# Patient Record
Sex: Female | Born: 1990 | Race: Black or African American | Hispanic: No | Marital: Single | Smoking: Current every day smoker
Health system: Southern US, Community
[De-identification: ages and names within clinical notes are randomized; demographics above are authoritative.]

## PROBLEM LIST (undated history)

## (undated) ENCOUNTER — Ambulatory Visit (HOSPITAL_COMMUNITY): Payer: Medicaid Other

## (undated) DIAGNOSIS — Z789 Other specified health status: Secondary | ICD-10-CM

## (undated) HISTORY — PX: THERAPEUTIC ABORTION: SHX798

## (undated) HISTORY — PX: WISDOM TOOTH EXTRACTION: SHX21

---

## 2019-10-09 NOTE — L&D Delivery Note (Signed)
OB/GYN Faculty Practice Delivery Note  Sheri Hanna is a 29 y.o. [id number] s/p NSVD at [redacted]w[redacted]d. She was admitted for PPROM.   ROM: 12h 33m with particulate meconium fluid GBS Status: unknown   Maximum Maternal Temperature: 99.4 F    Labor Progress: . Patient arrived at 1 cm dilation and was managed expectantly on [facility] specialty care. After approximately 8 hours she started to feel contractions and was found to be 3-4 cm and was transferred to L&D, she then progressed quickly to fully dilated four hours later and had uncomplicated NSVD.   Delivery Date/Time: 04/25/2020 at 0753 Delivery: Called to room and patient was complete and pushing. Head delivered in OA position. No nuchal cord present. Shoulder and body delivered in usual fashion. Infant without spontaneous cry and poor tone, cord clamped and cut immediately and infant taken to warmer. Arterial cord gas and venous blood drawn. Placenta delivered spontaneously with gentle cord traction. Fundus firm with massage and Pitocin. Labia, perineum, vagina, and cervix inspected with no lacerations.   Placenta: 3v intact to L&D Complications: preterm delivery Lacerations: none EBL: 300 cc Analgesia: none   Infant: Female infant  APGAR (1 MIN): 4   APGAR (5 MINS): 7    Weight: 2300 grams  Zack Seal, MD/[facility] OB/GYN Fellow, Faculty Practice

## 2019-10-21 LAB — OB RESULTS CONSOLE HIV ANTIBODY (ROUTINE TESTING): HIV: NONREACTIVE

## 2019-10-21 LAB — OB RESULTS CONSOLE HEPATITIS B SURFACE ANTIGEN: Hepatitis B Surface Ag: NEGATIVE

## 2019-10-21 LAB — OB RESULTS CONSOLE TSH: TSH: 1.09

## 2019-11-15 ENCOUNTER — Inpatient Hospital Stay (HOSPITAL_COMMUNITY): Payer: Medicaid Other

## 2019-11-15 ENCOUNTER — Inpatient Hospital Stay (HOSPITAL_COMMUNITY)
Admission: AD | Admit: 2019-11-15 | Discharge: 2019-11-15 | Disposition: A | Discharge status: Home / Self Care | Payer: Medicaid Other | Attending: Obstetrics and Gynecology | Admitting: Obstetrics and Gynecology

## 2019-11-15 ENCOUNTER — Other Ambulatory Visit: Payer: Self-pay

## 2019-11-15 ENCOUNTER — Encounter (HOSPITAL_COMMUNITY): Payer: Self-pay | Admitting: Obstetrics and Gynecology

## 2019-11-15 DIAGNOSIS — O418X10 Other specified disorders of amniotic fluid and membranes, first trimester, not applicable or unspecified: Secondary | ICD-10-CM | POA: Diagnosis not present

## 2019-11-15 DIAGNOSIS — O4690 Antepartum hemorrhage, unspecified, unspecified trimester: Secondary | ICD-10-CM

## 2019-11-15 DIAGNOSIS — Z3A08 8 weeks gestation of pregnancy: Secondary | ICD-10-CM | POA: Diagnosis not present

## 2019-11-15 DIAGNOSIS — O208 Other hemorrhage in early pregnancy: Secondary | ICD-10-CM | POA: Diagnosis not present

## 2019-11-15 DIAGNOSIS — Z6790 Unspecified blood type, Rh positive: Secondary | ICD-10-CM

## 2019-11-15 DIAGNOSIS — Z3490 Encounter for supervision of normal pregnancy, unspecified, unspecified trimester: Secondary | ICD-10-CM

## 2019-11-15 DIAGNOSIS — O468X1 Other antepartum hemorrhage, first trimester: Secondary | ICD-10-CM

## 2019-11-15 DIAGNOSIS — Z87891 Personal history of nicotine dependence: Secondary | ICD-10-CM | POA: Insufficient documentation

## 2019-11-15 DIAGNOSIS — O209 Hemorrhage in early pregnancy, unspecified: Secondary | ICD-10-CM | POA: Diagnosis present

## 2019-11-15 LAB — COMPREHENSIVE METABOLIC PANEL
ALT: 12 U/L (ref 0–44)
AST: 15 U/L (ref 15–41)
Albumin: 3.8 g/dL (ref 3.5–5.0)
Alkaline Phosphatase: 34 U/L — ABNORMAL LOW (ref 38–126)
Anion gap: 7 (ref 5–15)
BUN: 8 mg/dL (ref 6–20)
CO2: 27 mmol/L (ref 22–32)
Calcium: 9.8 mg/dL (ref 8.9–10.3)
Chloride: 103 mmol/L (ref 98–111)
Creatinine, Ser: 0.57 mg/dL (ref 0.44–1.00)
GFR calc Af Amer: >60 mL/min (ref >60)
GFR calc non Af Amer: >60 mL/min (ref >60)
Glucose, Bld: 91 mg/dL (ref 70–99)
Potassium: 4.1 mmol/L (ref 3.5–5.1)
Sodium: 137 mmol/L (ref 135–145)
Total Bilirubin: 0.2 mg/dL — ABNORMAL LOW (ref 0.3–1.2)
Total Protein: 6.5 g/dL (ref 6.5–8.1)

## 2019-11-15 LAB — ABO/RH: ABO/RH(D): B POS

## 2019-11-15 LAB — URINALYSIS, ROUTINE W REFLEX MICROSCOPIC
Bilirubin Urine: NEGATIVE
Glucose, UA: NEGATIVE mg/dL
Hgb urine dipstick: NEGATIVE
Ketones, ur: NEGATIVE mg/dL
Leukocytes,Ua: NEGATIVE
Nitrite: NEGATIVE
Protein, ur: NEGATIVE mg/dL
Specific Gravity, Urine: 1.027 (ref 1.005–1.030)
pH: 8 (ref 5.0–8.0)

## 2019-11-15 LAB — WET PREP, GENITAL
Sperm: NONE SEEN
Trich, Wet Prep: NONE SEEN
Yeast Wet Prep HPF POC: NONE SEEN

## 2019-11-15 LAB — CBC
HCT: 36.4 % (ref 36.0–46.0)
Hemoglobin: 12.5 g/dL (ref 12.0–15.0)
MCH: 30.6 pg (ref 26.0–34.0)
MCHC: 34.3 g/dL (ref 30.0–36.0)
MCV: 89 fL (ref 80.0–100.0)
Platelets: 245 10*3/uL (ref 150–400)
RBC: 4.09 MIL/uL (ref 3.87–5.11)
RDW: 12.8 % (ref 11.5–15.5)
WBC: 6.3 10*3/uL (ref 4.0–10.5)
nRBC: 0 % (ref 0.0–0.2)

## 2019-11-15 LAB — POCT PREGNANCY, URINE: Preg Test, Ur: POSITIVE — AB

## 2019-11-15 LAB — HCG, QUANTITATIVE, PREGNANCY: hCG, Beta Chain, Quant, S: 126882 m[IU]/mL — ABNORMAL HIGH (ref <5)

## 2019-11-15 NOTE — Discharge Instructions (Signed)
 Abdominal Pain During Pregnancy  Abdominal pain is common during pregnancy, and has many possible causes. Some causes are more serious than others, and sometimes the cause is not known. Abdominal pain can be a sign that labor is starting. It can also be caused by normal growth and stretching of muscles and ligaments during pregnancy. Always tell your health care provider if you have any abdominal pain. Follow these instructions at home:  Do not have sex or put anything in your vagina until your pain goes away completely.  Get plenty of rest until your pain improves.  Drink enough fluid to keep your urine pale yellow.  Take over-the-counter and prescription medicines only as told by your health care provider.  Keep all follow-up visits as told by your health care provider. This is important. Contact a health care provider if:  Your pain continues or gets worse after resting.  You have lower abdominal pain that: ? Comes and goes at regular intervals. ? Spreads to your back. ? Is similar to menstrual cramps.  You have pain or burning when you urinate. Get help right away if:  You have a fever or chills.  You have vaginal bleeding.  You are leaking fluid from your vagina.  You are passing tissue from your vagina.  You have vomiting or diarrhea that lasts for more than 24 hours.  Your baby is moving less than usual.  You feel very weak or faint.  You have shortness of breath.  You develop severe pain in your upper abdomen. Summary  Abdominal pain is common during pregnancy, and has many possible causes.  If you experience abdominal pain during pregnancy, tell your health care provider right away.  Follow your health care provider's home care instructions and keep all follow-up visits as directed. This information is not intended to replace advice given to you by your health care provider. Make sure you discuss any questions you have with your health care  provider. Document Revised: 01/12/2019 Document Reviewed: 12/27/2016 Elsevier Patient Education  2020 [employer]  First Trimester of Pregnancy The first trimester of pregnancy is from week 1 until the end of week 13 (months 1 through 3). A week after a sperm fertilizes an egg, the egg will implant on the wall of the uterus. This embryo will begin to develop into a baby. Genes from you and your partner will form the baby. The female genes will determine whether the baby will be a boy or a girl. At 6-8 weeks, the eyes and face will be formed, and the heartbeat can be seen on ultrasound. At the end of 12 weeks, all the baby's organs will be formed. Now that you are pregnant, you will want to do everything you can to have a healthy baby. Two of the most important things are to get good prenatal care and to follow your health care provider's instructions. Prenatal care is all the medical care you receive before the baby's birth. This care will help prevent, find, and treat any problems during the pregnancy and childbirth. Body changes during your first trimester Your body goes through many changes during pregnancy. The changes vary from woman to woman.  You may gain or lose a couple of pounds at first.  You may feel sick to your stomach (nauseous) and you may throw up (vomit). If the vomiting is uncontrollable, call your health care provider.  You may tire easily.  You may develop headaches that can be relieved by medicines. All medicines   should be approved by your health care provider.  You may urinate more often. Painful urination may mean you have a bladder infection.  You may develop heartburn as a result of your pregnancy.  You may develop constipation because certain hormones are causing the muscles that push stool through your intestines to slow down.  You may develop hemorrhoids or swollen veins (varicose veins).  Your breasts may begin to grow larger and become tender. Your nipples  may stick out more, and the tissue that surrounds them (areola) may become darker.  Your gums may bleed and may be sensitive to brushing and flossing.  Dark spots or blotches (chloasma, mask of pregnancy) may develop on your face. This will likely fade after the baby is born.  Your menstrual periods will stop.  You may have a loss of appetite.  You may develop cravings for certain kinds of food.  You may have changes in your emotions from day to day, such as being excited to be pregnant or being concerned that something may go wrong with the pregnancy and baby.  You may have more vivid and strange dreams.  You may have changes in your hair. These can include thickening of your hair, rapid growth, and changes in texture. Some women also have hair loss during or after pregnancy, or hair that feels dry or thin. Your hair will most likely return to normal after your baby is born. What to expect at prenatal visits During a routine prenatal visit:  You will be weighed to make sure you and the baby are growing normally.  Your blood pressure will be taken.  Your abdomen will be measured to track your baby's growth.  The fetal heartbeat will be listened to between weeks 10 and 14 of your pregnancy.  Test results from any previous visits will be discussed. Your health care provider may ask you:  How you are feeling.  If you are feeling the baby move.  If you have had any abnormal symptoms, such as leaking fluid, bleeding, severe headaches, or abdominal cramping.  If you are using any tobacco products, including cigarettes, chewing tobacco, and electronic cigarettes.  If you have any questions. Other tests that may be performed during your first trimester include:  Blood tests to find your blood type and to check for the presence of any previous infections. The tests will also be used to check for low iron levels (anemia) and protein on red blood cells (Rh antibodies). Depending on  your risk factors, or if you previously had diabetes during pregnancy, you may have tests to check for high blood sugar that affects pregnant women (gestational diabetes).  Urine tests to check for infections, diabetes, or protein in the urine.  An ultrasound to confirm the proper growth and development of the baby.  Fetal screens for spinal cord problems (spina bifida) and Down syndrome.  HIV (human immunodeficiency virus) testing. Routine prenatal testing includes screening for HIV, unless you choose not to have this test.  You may need other tests to make sure you and the baby are doing well. Follow these instructions at home: Medicines  Follow your health care provider's instructions regarding medicine use. Specific medicines may be either safe or unsafe to take during pregnancy.  Take a prenatal vitamin that contains at least 600 micrograms (mcg) of folic acid.  If you develop constipation, try taking a stool softener if your health care provider approves. Eating and drinking   Eat a balanced diet that includes fresh   fruits and vegetables, whole grains, good sources of protein such as meat, eggs, or tofu, and low-fat dairy. Your health care provider will help you determine the amount of weight gain that is right for you.  Avoid raw meat and uncooked cheese. These carry germs that can cause birth defects in the baby.  Eating four or five small meals rather than three large meals a day may help relieve nausea and vomiting. If you start to feel nauseous, eating a few soda crackers can be helpful. Drinking liquids between meals, instead of during meals, also seems to help ease nausea and vomiting.  Limit foods that are high in fat and processed sugars, such as fried and sweet foods.  To prevent constipation: ? Eat foods that are high in fiber, such as fresh fruits and vegetables, whole grains, and beans. ? Drink enough fluid to keep your urine clear or pale  yellow. Activity  Exercise only as directed by your health care provider. Most women can continue their usual exercise routine during pregnancy. Try to exercise for 30 minutes at least 5 days a week. Exercising will help you: ? Control your weight. ? Stay in shape. ? Be prepared for labor and delivery.  Experiencing pain or cramping in the lower abdomen or lower back is a good sign that you should stop exercising. Check with your health care provider before continuing with normal exercises.  Try to avoid standing for long periods of time. Move your legs often if you must stand in one place for a long time.  Avoid heavy lifting.  Wear low-heeled shoes and practice good posture.  You may continue to have sex unless your health care provider tells you not to. Relieving pain and discomfort  Wear a good support bra to relieve breast tenderness.  Take warm sitz baths to soothe any pain or discomfort caused by hemorrhoids. Use hemorrhoid cream if your health care provider approves.  Rest with your legs elevated if you have leg cramps or low back pain.  If you develop varicose veins in your legs, wear support hose. Elevate your feet for 15 minutes, 3-4 times a day. Limit salt in your diet. Prenatal care  Schedule your prenatal visits by the twelfth week of pregnancy. They are usually scheduled monthly at first, then more often in the last 2 months before delivery.  Write down your questions. Take them to your prenatal visits.  Keep all your prenatal visits as told by your health care provider. This is important. Safety  Wear your seat belt at all times when driving.  Make a list of emergency phone numbers, including numbers for family, friends, the hospital, and police and fire departments. General instructions  Ask your health care provider for a referral to a local prenatal education class. Begin classes no later than the beginning of month 6 of your pregnancy.  Ask for help if  you have counseling or nutritional needs during pregnancy. Your health care provider can offer advice or refer you to specialists for help with various needs.  Do not use hot tubs, steam rooms, or saunas.  Do not douche or use tampons or scented sanitary pads.  Do not cross your legs for long periods of time.  Avoid cat litter boxes and soil used by cats. These carry germs that can cause birth defects in the baby and possibly loss of the fetus by miscarriage or stillbirth.  Avoid all smoking, herbs, alcohol, and medicines not prescribed by your health care provider. Chemicals   in these products affect the formation and growth of the baby.  Do not use any products that contain nicotine or tobacco, such as cigarettes and e-cigarettes. If you need help quitting, ask your health care provider. You may receive counseling support and other resources to help you quit.  Schedule a dentist appointment. At home, brush your teeth with a soft toothbrush and be gentle when you floss. Contact a health care provider if:  You have dizziness.  You have mild pelvic cramps, pelvic pressure, or nagging pain in the abdominal area.  You have persistent nausea, vomiting, or diarrhea.  You have a bad smelling vaginal discharge.  You have pain when you urinate.  You notice increased swelling in your face, hands, legs, or ankles.  You are exposed to fifth disease or chickenpox.  You are exposed to German measles (rubella) and have never had it. Get help right away if:  You have a fever.  You are leaking fluid from your vagina.  You have spotting or bleeding from your vagina.  You have severe abdominal cramping or pain.  You have rapid weight gain or loss.  You vomit blood or material that looks like coffee grounds.  You develop a severe headache.  You have shortness of breath.  You have any kind of trauma, such as from a fall or a car accident. Summary  The first trimester of pregnancy is  from week 1 until the end of week 13 (months 1 through 3).  Your body goes through many changes during pregnancy. The changes vary from woman to woman.  You will have routine prenatal visits. During those visits, your health care provider will examine you, discuss any test results you may have, and talk with you about how you are feeling. This information is not intended to replace advice given to you by your health care provider. Make sure you discuss any questions you have with your health care provider. Document Revised: 09/06/2017 Document Reviewed: 09/05/2016 Elsevier Patient Education  2020 [employer] Inc.  Vaginal Bleeding During Pregnancy, First Trimester  A small amount of bleeding from the vagina (spotting) is relatively common during early pregnancy. It usually stops on its own. Various things may cause bleeding or spotting during early pregnancy. Some bleeding may be related to the pregnancy, and some may not. In many cases, the bleeding is normal and is not a problem. However, bleeding can also be a sign of something serious. Be sure to tell your health care provider about any vaginal bleeding right away. Some possible causes of vaginal bleeding during the first trimester include:  Infection or inflammation of the cervix.  Growths (polyps) on the cervix.  Miscarriage or threatened miscarriage.  Pregnancy tissue developing outside of the uterus (ectopic pregnancy).  A mass of tissue developing in the uterus due to an egg being fertilized incorrectly (molar pregnancy). Follow these instructions at home: Activity  Follow instructions from your health care provider about limiting your activity. Ask what activities are safe for you.  If needed, make plans for someone to help with your regular activities.  Do not have sex or orgasms until your health care provider says that this is safe. General instructions  Take over-the-counter and prescription medicines only as told by your  health care provider.  Pay attention to any changes in your symptoms.  Do not use tampons or douche.  Write down how many pads you use each day, how often you change pads, and how soaked (saturated) they are.    If you pass any tissue from your vagina, save the tissue so you can show it to your health care provider.  Keep all follow-up visits as told by your health care provider. This is important. Contact a health care provider if:  You have vaginal bleeding during any part of your pregnancy.  You have cramps or labor pains.  You have a fever. Get help right away if:  You have severe cramps in your back or abdomen.  You pass large clots or a large amount of tissue from your vagina.  Your bleeding increases.  You feel light-headed or weak, or you faint.  You have chills.  You are leaking fluid or have a gush of fluid from your vagina. Summary  A small amount of bleeding (spotting) from the vagina is relatively common during early pregnancy.  Various things may cause bleeding or spotting in early pregnancy.  Be sure to tell your health care provider about any vaginal bleeding right away. This information is not intended to replace advice given to you by your health care provider. Make sure you discuss any questions you have with your health care provider. Document Revised: 01/13/2019 Document Reviewed: 12/27/2016 Elsevier Patient Education  2020 [employer] Inc.  Subchorionic Hematoma  A subchorionic hematoma is a gathering of blood between the outer wall of the embryo (chorion) and the inner wall of the womb (uterus). This condition can cause vaginal bleeding. If they cause little or no vaginal bleeding, early small hematomas usually shrink on their own and do not affect your baby or pregnancy. When bleeding starts later in pregnancy, or if the hematoma is larger or occurs in older pregnant women, the condition may be more serious. Larger hematomas may get bigger, which  increases the chances of miscarriage. This condition also increases the risk of:  Premature separation of the placenta from the uterus.  Premature (preterm) labor.  Stillbirth. What are the causes? The exact cause of this condition is not known. It occurs when blood is trapped between the placenta and the uterine wall because the placenta has separated from the original site of implantation. What increases the risk? You are more likely to develop this condition if:  You were treated with fertility medicines.  You conceived through in vitro fertilization (IVF). What are the signs or symptoms? Symptoms of this condition include:  Vaginal spotting or bleeding.  Contractions of the uterus. These cause abdominal pain. Sometimes you may have no symptoms and the bleeding may only be seen when ultrasound images are taken (transvaginal ultrasound). How is this diagnosed? This condition is diagnosed based on a physical exam. This includes a pelvic exam. You may also have other tests, including:  Blood tests.  Urine tests.  Ultrasound of the abdomen. How is this treated? Treatment for this condition can vary. Treatment may include:  Watchful waiting. You will be monitored closely for any changes in bleeding. During this stage: ? The hematoma may be reabsorbed by the body. ? The hematoma may separate the fluid-filled space containing the embryo (gestational sac) from the wall of the womb (endometrium).  Medicines.  Activity restriction. This may be needed until the bleeding stops. Follow these instructions at home:  Stay on bed rest if told to do so by your health care provider.  Do not lift anything that is heavier than 10 lbs. (4.5 kg) or as told by your health care provider.  Do not use any products that contain nicotine or tobacco, such as cigarettes and e-cigarettes.   If you need help quitting, ask your health care provider.  Track and write down the number of pads you use  each day and how soaked (saturated) they are.  Do not use tampons.  Keep all follow-up visits as told by your health care provider. This is important. Your health care provider may ask you to have follow-up blood tests or ultrasound tests or both. Contact a health care provider if:  You have any vaginal bleeding.  You have a fever. Get help right away if:  You have severe cramps in your stomach, back, abdomen, or pelvis.  You pass large clots or tissue. Save any tissue for your health care provider to look at.  You have more vaginal bleeding, and you faint or become lightheaded or weak. Summary  A subchorionic hematoma is a gathering of blood between the outer wall of the placenta and the uterus.  This condition can cause vaginal bleeding.  Sometimes you may have no symptoms and the bleeding may only be seen when ultrasound images are taken.  Treatment may include watchful waiting, medicines, or activity restriction. This information is not intended to replace advice given to you by your health care provider. Make sure you discuss any questions you have with your health care provider. Document Revised: 09/06/2017 Document Reviewed: 11/20/2016 Elsevier Patient Education  2020 [employer] Inc.                  Safe Medications in Pregnancy    Acne: Benzoyl Peroxide Salicylic Acid  Backache/Headache: Tylenol: 2 regular strength every 4 hours OR              2 Extra strength every 6 hours  Colds/Coughs/Allergies: Benadryl (alcohol free) 25 mg every 6 hours as needed Breath right strips Claritin Cepacol throat lozenges Chloraseptic throat spray Cold-Eeze- up to three times per day Cough drops, alcohol free Flonase (by prescription only) Guaifenesin Mucinex Robitussin DM (plain only, alcohol free) Saline nasal spray/drops Sudafed (pseudoephedrine) & Actifed ** use only after [redacted] weeks gestation and if you do not have high blood pressure Tylenol Vicks Vaporub Zinc  lozenges Zyrtec   Constipation: Colace Ducolax suppositories Fleet enema Glycerin suppositories Metamucil Milk of magnesia Miralax Senokot Smooth move tea  Diarrhea: Kaopectate Imodium A-D  *NO pepto Bismol  Hemorrhoids: Anusol Anusol HC Preparation H Tucks  Indigestion: Tums Maalox Mylanta Zantac  Pepcid  Insomnia: Benadryl (alcohol free) 25mg every 6 hours as needed Tylenol PM Unisom, no Gelcaps  Leg Cramps: Tums MagGel  Nausea/Vomiting:  Bonine Dramamine Emetrol Ginger extract Sea bands Meclizine  Nausea medication to take during pregnancy:  Unisom (doxylamine succinate 25 mg tablets) Take one tablet daily at bedtime. If symptoms are not adequately controlled, the dose can be increased to a maximum recommended dose of two tablets daily (1/2 tablet in the morning, 1/2 tablet mid-afternoon and one at bedtime). Vitamin B6 100mg tablets. Take one tablet twice a day (up to 200 mg per day).  Skin Rashes: Aveeno products Benadryl cream or 25mg every 6 hours as needed Calamine Lotion 1% cortisone cream  Yeast infection: Gyne-lotrimin 7 Monistat 7   **If taking multiple medications, please check labels to avoid duplicating the same active ingredients **take medication as directed on the label ** Do not exceed 4000 mg of tylenol in 24 hours **Do not take medications that contain aspirin or ibuprofen 

## 2019-11-15 NOTE — [facility] Note (Signed)
Sheri Hanna is a 29 y.o. at [redacted]w[redacted]d here in [facility] reporting: spotting and cramping that started today. Did not see any bleeding when she used the bathroom in [facility]U. Cramping is in her back.   Has been seen in [place] for this pregnancy. States she has had blood work and u/s. States they saw cardiac activity on jan 26.   LMP: 09/15/19  Onset of complaint: today  Pain score: 5/10  Vitals:   11/15/19 1314  BP: 114/67  Pulse: (!) 101  Resp: 16  Temp: 98.9 F (37.2 C)  SpO2: 100%     Lab orders placed from triage: UA, UPT

## 2019-11-15 NOTE — MAU Provider Note (Signed)
History     CSN: [id number]  Arrival date and time: 11/15/19 1251   First Provider Initiated Contact with Patient 11/15/19 1404      Chief Complaint  Patient presents with  . Vaginal Bleeding  . Back Pain   [name] is a 29 y.o. [id number] at [redacted]w[redacted]d who presents to MAU for vaginal bleeding which began this morning around 1143AM. Pt reports the bleeding was light pink when wiping. Pt gets OB care in [place] and is in [place] visiting family.  Passing blood clots? no Blood soaking clothes? no Lightheaded/dizzy? no Significant pelvic pain or cramping? minimal Passed any tissue? no Hx ectopic pregnancy? no Hx of PID, GYN surgery? no Hx of recurrent pregnancy loss/associated condition? no, medical ABx2  Current pregnancy problems? Left ovarian cyst per OB in [place], spotting in first 4 weeks cause unknown Blood Type? unknown Allergies? NKDA Current medications? PNVs Current [facility] & next appt? [place] [facility], 11/23/2019  Pt denies vaginal discharge/odor/itching. Pt denies N/V, abdominal pain, constipation, diarrhea, or urinary problems. Pt denies fever, chills, fatigue, sweating or changes in appetite. Pt denies SOB or chest pain. Pt denies dizziness, HA, light-headedness, weakness.   OB History    Gravida  8   Para  1   Term  1   Preterm      AB  6   Living        SAB      TAB  6   Ectopic      Multiple      Live Births              History reviewed. No pertinent past medical history.  Past Surgical History:  Procedure Laterality Date  . THERAPEUTIC ABORTION    . WISDOM TOOTH EXTRACTION      Family History  Problem Relation Age of Onset  . Hypertension Mother   . Diabetes Maternal Grandmother     Social History   Tobacco Use  . Smoking status: Former Games developer  . Smokeless tobacco: Never Used  . Tobacco comment: none since + UPT  Substance Use Topics  . Alcohol use: Not Currently  . Drug use: Never    Allergies: No Known Allergies  No  medications prior to admission.    Review of Systems  Constitutional: Negative for chills, diaphoresis, fatigue and fever.  Eyes: Negative for visual disturbance.  Respiratory: Negative for shortness of breath.   Cardiovascular: Negative for chest pain.  Gastrointestinal: Negative for abdominal pain, constipation, diarrhea, nausea and vomiting.  Genitourinary: Positive for pelvic pain (minimal cramping) and vaginal bleeding. Negative for dysuria, flank pain, frequency, urgency and vaginal discharge.  Neurological: Negative for dizziness, weakness, light-headedness and headaches.   Physical Exam   Blood pressure 114/67, pulse (!) 101, temperature 98.9 F (37.2 C), temperature source Oral, resp. rate 16, height 5\' 2"  (1.575 m), weight 57.8 kg, last menstrual period 09/15/2019, SpO2 100 %.  Patient Vitals for the past 24 hrs:  BP Temp Temp src Pulse Resp SpO2 Height Weight  11/15/19 1314 114/67 98.9 F (37.2 C) Oral (!) 101 16 100 % 5\' 2"  (1.575 m) 57.8 kg   Physical Exam  Constitutional: She is oriented to person, place, and time. She appears well-developed and well-nourished. No distress.  HENT:  Head: Normocephalic and atraumatic.  Respiratory: Effort normal.  GI: Soft. She exhibits no distension and no mass. There is no abdominal tenderness. There is no rebound and no guarding.  Genitourinary: There is no rash, tenderness  or lesion on the right labia. There is no rash, tenderness or lesion on the left labia. Uterus is not enlarged and not tender. Cervix exhibits no motion tenderness, no discharge and no friability. Right adnexum displays no mass, no tenderness and no fullness. Left adnexum displays no mass, no tenderness and no fullness.    Vaginal bleeding (minute amount of light brown discharge, red on swabs upon withdrawl, no active bleeding noted.) present.     No vaginal discharge or tenderness.  There is bleeding (minute amount of light brown discharge, red on swabs upon  withdrawl, no active bleeding noted.) in the vagina. No tenderness in the vagina.    Genitourinary Comments: CE: closed/long/posterior   Neurological: She is alert and oriented to person, place, and time.  Skin: Skin is warm and dry. She is not diaphoretic.  Psychiatric: She has a normal mood and affect. Her behavior is normal. Judgment and thought content normal.   Results for orders placed or performed during the hospital encounter of 11/15/19 (from the past 24 hour(s))  Urinalysis, Routine w reflex microscopic     Status: Abnormal   Collection Time: 11/15/19  1:04 PM  Result Value Ref Range   Color, Urine YELLOW YELLOW   APPearance CLOUDY (A) CLEAR   Specific Gravity, Urine 1.027 1.005 - 1.030   pH 8.0 5.0 - 8.0   Glucose, UA NEGATIVE NEGATIVE mg/dL   Hgb urine dipstick NEGATIVE NEGATIVE   Bilirubin Urine NEGATIVE NEGATIVE   Ketones, ur NEGATIVE NEGATIVE mg/dL   Protein, ur NEGATIVE NEGATIVE mg/dL   Nitrite NEGATIVE NEGATIVE   Leukocytes,Ua NEGATIVE NEGATIVE  Pregnancy, urine POC     Status: Abnormal   Collection Time: 11/15/19  1:05 PM  Result Value Ref Range   Preg Test, Ur POSITIVE (A) NEGATIVE  CBC     Status: None   Collection Time: 11/15/19  1:53 PM  Result Value Ref Range   WBC 6.3 4.0 - 10.5 K/uL   RBC 4.09 3.87 - 5.11 MIL/uL   Hemoglobin 12.5 12.0 - 15.0 g/dL   HCT 36.4 36.0 - 46.0 %   MCV 89.0 80.0 - 100.0 fL   MCH 30.6 26.0 - 34.0 pg   MCHC 34.3 30.0 - 36.0 g/dL   RDW 12.8 11.5 - 15.5 %   Platelets 245 150 - 400 K/uL   nRBC 0.0 0.0 - 0.2 %  Comprehensive metabolic panel     Status: Abnormal   Collection Time: 11/15/19  1:53 PM  Result Value Ref Range   Sodium 137 135 - 145 mmol/L   Potassium 4.1 3.5 - 5.1 mmol/L   Chloride 103 98 - 111 mmol/L   CO2 27 22 - 32 mmol/L   Glucose, Bld 91 70 - 99 mg/dL   BUN 8 6 - 20 mg/dL   Creatinine, Ser 0.57 0.44 - 1.00 mg/dL   Calcium 9.8 8.9 - 10.3 mg/dL   Total Protein 6.5 6.5 - 8.1 g/dL   Albumin 3.8 3.5 - 5.0 g/dL    AST 15 15 - 41 U/L   ALT 12 0 - 44 U/L   Alkaline Phosphatase 34 (L) 38 - 126 U/L   Total Bilirubin 0.2 (L) 0.3 - 1.2 mg/dL   GFR calc non Af Amer >60 >60 mL/min   GFR calc Af Amer >60 >60 mL/min   Anion gap 7 5 - 15  hCG, quantitative, pregnancy     Status: Abnormal   Collection Time: 11/15/19  1:53 PM  Result Value  Ref Range   hCG, Beta Nyra Jabs, [place] 818,563 (H) <5 mIU/mL  ABO/Rh     Status: None   Collection Time: 11/15/19  1:53 PM  Result Value Ref Range   ABO/RH(D) B POS    No rh immune globuloin      NOT A RH IMMUNE GLOBULIN CANDIDATE, PT RH POSITIVE Performed at [facility], [address], [place], [place] [address]   Wet prep, genital     Status: Abnormal   Collection Time: 11/15/19  2:19 PM   Specimen: PATH Cytology Cervicovaginal Ancillary Only  Result Value Ref Range   Yeast Wet Prep HPF POC NONE SEEN NONE SEEN   Trich, Wet Prep NONE SEEN NONE SEEN   Clue Cells Wet Prep HPF POC PRESENT (A) NONE SEEN   WBC, Wet Prep HPF POC MANY (A) NONE SEEN   Sperm NONE SEEN    US OB Comp Less 14 Wks  Result Date: 11/15/2019 CLINICAL DATA:  Vaginal bleeding EXAM: OBSTETRIC <14 WK ULTRASOUND TECHNIQUE: Transabdominal ultrasound was performed for evaluation of the gestation as well as the maternal uterus and adnexal regions. COMPARISON:  None. FINDINGS: Intrauterine gestational sac: Single Yolk sac:  Visualized Embryo:  Visualized Cardiac Activity: Visualized Heart Rate: 169 bpm MSD:    mm    w     d CRL:   21.1 mm   8 w 5 d                  [place] EDC: 06/21/2020 Subchorionic hemorrhage:  Small subchorionic hemorrhage Maternal uterus/adnexae: No adnexal mass or free fluid IMPRESSION: Eight week 5 day intrauterine pregnancy. Fetal heart rate 169 beats per minute. Small subchorionic hemorrhage. Electronically Signed   By: Charlett Nose M.D.   On: 11/15/2019 15:07    MAU Course  Procedures  MDM -r/o ectopic -UA: cloudy, otherwise WNL, sending urine for culture based on  symptoms -CBC: WNL -CMP: no abnormalities requiring treatment -[place]: single IUP, FHR 169, [redacted]w[redacted]d, small SCH -hCG: 263,785 -ABO: B Positive -WetPrep: +ClueCells (isolated finding) -GC/CT collected -pt discharged to home in stable condition  Orders Placed This Encounter  Procedures  . Wet prep, genital    Standing Status:   Standing    Number of Occurrences:   1  . Culture, OB Urine    Standing Status:   Standing    Number of Occurrences:   1  . US OB Comp Less 14 Wks    Standing Status:   Standing    Number of Occurrences:   1    Order Specific Question:   Symptom/Reason for Exam    Answer:   Vaginal bleeding in pregnancy [[id number]]  . Urinalysis, Routine w reflex microscopic    Standing Status:   Standing    Number of Occurrences:   1  . CBC    Standing Status:   Standing    Number of Occurrences:   1  . Comprehensive metabolic panel    Standing Status:   Standing    Number of Occurrences:   1  . hCG, quantitative, pregnancy    Standing Status:   Standing    Number of Occurrences:   1  . Pregnancy, urine POC    Standing Status:   Standing    Number of Occurrences:   1  . ABO/Rh    Standing Status:   Standing    Number of Occurrences:   1  . Discharge patient    Order Specific  Question:   Discharge disposition    Answer:   01-Home or Self Care [1]    Order Specific Question:   Discharge patient date    Answer:   11/15/2019   No orders of the defined types were placed in this encounter.  Assessment and Plan   1. Subchorionic hemorrhage of placenta in first trimester, single or unspecified fetus   2. Vaginal bleeding in pregnancy   3. Blood type, Rh positive   4. [redacted] weeks gestation of pregnancy   5. Intrauterine pregnancy    Allergies as of 11/15/2019   No Known Allergies     Medication List    You have not been prescribed any medications.    -pt to keep next appt with OB -list of safe meds in pregnancy given -discussed expected s/sx of [facility] -return MAU  precautions given -pt discharged to home in stable condition  Joni Reining E  11/15/2019, 3:53 PM

## 2019-11-16 LAB — CULTURE, OB URINE: Culture: NO GROWTH

## 2019-11-16 LAB — GC/CHLAMYDIA PROBE AMP (~~LOC~~) NOT AT ARMC
Chlamydia: NEGATIVE
Comment: NEGATIVE
Comment: NORMAL
Neisseria Gonorrhea: NEGATIVE

## 2020-03-08 ENCOUNTER — Inpatient Hospital Stay (HOSPITAL_COMMUNITY)
Admission: AD | Admit: 2020-03-08 | Discharge: 2020-03-08 | Disposition: A | Discharge status: Home / Self Care | Payer: Medicaid Other | Attending: Obstetrics & Gynecology | Admitting: Obstetrics & Gynecology

## 2020-03-08 ENCOUNTER — Encounter (HOSPITAL_COMMUNITY): Payer: Self-pay | Admitting: Obstetrics & Gynecology

## 2020-03-08 ENCOUNTER — Other Ambulatory Visit: Payer: Self-pay

## 2020-03-08 DIAGNOSIS — O26892 Other specified pregnancy related conditions, second trimester: Secondary | ICD-10-CM | POA: Insufficient documentation

## 2020-03-08 DIAGNOSIS — R519 Headache, unspecified: Secondary | ICD-10-CM | POA: Diagnosis not present

## 2020-03-08 DIAGNOSIS — Z87891 Personal history of nicotine dependence: Secondary | ICD-10-CM | POA: Diagnosis not present

## 2020-03-08 DIAGNOSIS — Z3A25 25 weeks gestation of pregnancy: Secondary | ICD-10-CM

## 2020-03-08 LAB — URINALYSIS, ROUTINE W REFLEX MICROSCOPIC
Bilirubin Urine: NEGATIVE
Glucose, UA: NEGATIVE mg/dL
Hgb urine dipstick: NEGATIVE
Ketones, ur: NEGATIVE mg/dL
Leukocytes,Ua: NEGATIVE
Nitrite: NEGATIVE
Protein, ur: NEGATIVE mg/dL
Specific Gravity, Urine: 1.018 (ref 1.005–1.030)
pH: 7 (ref 5.0–8.0)

## 2020-03-08 MED ORDER — ACETAMINOPHEN 325 MG PO TABS
650.0000 mg | ORAL_TABLET | Freq: Once | ORAL | Status: AC
  Administered 2020-03-08: 650 mg via ORAL
  Filled 2020-03-08: qty 2

## 2020-03-08 NOTE — MAU Provider Note (Signed)
Patient Sheri Hanna is 29 y.o. [id number] at [redacted]w[redacted]d here with complaints with headache and worry about her baby's "jerky" movements. She denies vaginal bleeding, vaginal discharge, LOF, contractions, decreased fetal movements. Patient gets care in [place]; she will transfer care down here.    Patient reports that she felt "weird" fetal movements yesterday afternoon; she read that it could be a seizure and woke up with heavy anxiety; she arranged childcare and then came in to be seen.   History     CSN: [id number]  Arrival date and time: 03/08/20 1400   None     Chief Complaint  Patient presents with  . different fetal movement  . Headache   Headache  This is a new problem. The current episode started yesterday. The problem occurs constantly. The pain is located in the frontal region. The pain quality is not similar to prior headaches. The quality of the pain is described as aching. The pain is at a severity of 6/10. Pertinent negatives include no abdominal pain, blurred vision, coughing, vomiting or weakness. Nothing aggravates the symptoms. She has tried nothing for the symptoms.  The patient also reports that she felt "weird" yesterday afternoon. The baby was moving a lot; she felt like the baby was moving "too much" and she looked on Google to see what was wrong. She read that baby may have had a seizure; she is here to see if she can have an [place] to check on baby.   OB History    Gravida  8   Para  1   Term  1   Preterm      AB  6   Living  1     SAB      TAB  6   Ectopic      Multiple      Live Births  1           History reviewed. No pertinent past medical history.  Past Surgical History:  Procedure Laterality Date  . THERAPEUTIC ABORTION    . WISDOM TOOTH EXTRACTION      Family History  Problem Relation Age of Onset  . Hypertension Mother   . Diabetes Maternal Grandmother     Social History   Tobacco Use  . Smoking status: Former Research scientist (life sciences)  .  Smokeless tobacco: Never Used  . Tobacco comment: none since + UPT  Substance Use Topics  . Alcohol use: Not Currently  . Drug use: Never    Allergies: No Known Allergies  Medications Prior to Admission  Medication Sig Dispense Refill Last Dose  . Prenatal Vit-Fe Fumarate-FA (PRENATAL MULTIVITAMIN) TABS tablet Take 1 tablet by mouth daily at 12 noon.   03/08/2020 at Unknown time    Review of Systems  Constitutional: Negative.   HENT: Negative.   Eyes: Negative for blurred vision.  Respiratory: Negative.  Negative for cough.   Cardiovascular: Negative.   Gastrointestinal: Negative.  Negative for abdominal pain and vomiting.  Genitourinary: Negative.   Musculoskeletal: Negative.   Neurological: Positive for headaches. Negative for weakness.   Physical Exam   Blood pressure 124/69, pulse 96, temperature 98.4 F (36.9 C), temperature source Oral, resp. rate 16, height 5\' 2"  (1.575 m), weight 70.9 kg, last menstrual period 09/15/2019, SpO2 100 %.  Physical Exam  Constitutional: She appears well-developed and well-nourished.  HENT:  Head: Normocephalic.  Respiratory: Effort normal.  GI: Soft.  Musculoskeletal:        General: Normal range of motion.  Cervical back: Normal range of motion.  Neurological: She is alert.  Skin: Skin is warm and dry.    MAU Course  Procedures  MDM -NST: 150 bpm mod var present acel, neg decels, no contractions. NST reactive.  -Tylenol given, patient felt better. HA is now a 2/10 and she reports she just "feels tired" and wants to go home.   Assessment and Plan   1. Acute nonintractable headache, unspecified headache type    2. Explained to patient that it is very unlikely that baby has had a seizure; further work-up without any other concerning symptoms is not necessary. Reassured her that active babies are healthy babies.   3. Patient will bring her records from [place] and establish care at a [facility] office in the next month; encouraged patient  to reach out now to schedule a visit and not to skip any appts in [place] (she has upcoming glucola).   4. Patient stable for discharge; all questions answered.   Charlesetta Garibaldi Arion Shankles 03/08/2020, 3:45 PM

## 2020-03-08 NOTE — MAU Note (Signed)
Has had a headache since yesterday, was worse yesterday.  Has not taken anything.  Baby's movement has been off. - "dr Waverly Ferrari, said it could be seizures, kind of like a heartbeat".  Still going back and forth to [place] for prenatal care, waiting on insurance.

## 2020-03-15 LAB — OB RESULTS CONSOLE VARICELLA ZOSTER ANTIBODY, IGG: Varicella: IMMUNE

## 2020-03-15 LAB — OB RESULTS CONSOLE RPR: RPR: NONREACTIVE

## 2020-03-15 LAB — OB RESULTS CONSOLE ANTIBODY SCREEN: Antibody Screen: NEGATIVE

## 2020-03-15 LAB — OB RESULTS CONSOLE HGB/HCT, BLOOD
HCT: 37 (ref 29–41)
Hemoglobin: 11.9

## 2020-03-15 LAB — OB RESULTS CONSOLE RUBELLA ANTIBODY, IGM: Rubella: IMMUNE

## 2020-03-15 LAB — OB RESULTS CONSOLE PLATELET COUNT: Platelets: 233

## 2020-04-21 ENCOUNTER — Other Ambulatory Visit: Payer: Self-pay

## 2020-04-21 ENCOUNTER — Encounter: Payer: Self-pay | Admitting: Obstetrics and Gynecology

## 2020-04-21 ENCOUNTER — Ambulatory Visit (INDEPENDENT_AMBULATORY_CARE_PROVIDER_SITE_OTHER): Payer: Medicaid Other | Admitting: Obstetrics and Gynecology

## 2020-04-21 VITALS — BP 117/78 | HR 103 | Wt 172.0 lb

## 2020-04-21 DIAGNOSIS — O26843 Uterine size-date discrepancy, third trimester: Secondary | ICD-10-CM | POA: Diagnosis not present

## 2020-04-21 DIAGNOSIS — Z3480 Encounter for supervision of other normal pregnancy, unspecified trimester: Secondary | ICD-10-CM

## 2020-04-21 DIAGNOSIS — M549 Dorsalgia, unspecified: Secondary | ICD-10-CM

## 2020-04-21 DIAGNOSIS — O99891 Other specified diseases and conditions complicating pregnancy: Secondary | ICD-10-CM | POA: Diagnosis not present

## 2020-04-21 DIAGNOSIS — Z3A32 32 weeks gestation of pregnancy: Secondary | ICD-10-CM

## 2020-04-21 MED ORDER — COMFORT FIT MATERNITY SUPP LG MISC: 1.0000 | Freq: Every day | 0 refills | Status: DC | PRN

## 2020-04-21 NOTE — Progress Notes (Signed)
Pt states she is having increase in pelvic pressure/ heavy feeling, having some ctx. Pt states that she was given due date of 06-15-20- by u/s she had in [place].

## 2020-04-21 NOTE — Patient Instructions (Signed)
Third Trimester of Pregnancy  The third trimester is from week 28 through week 40 (months 7 through 9). This trimester is when your unborn baby (fetus) is growing very fast. At the end of the ninth month, the unborn baby is about 20 inches in length. It weighs about 6-10 pounds. Follow these instructions at home: Medicines  Take over-the-counter and prescription medicines only as told by your doctor. Some medicines are safe and some medicines are not safe during pregnancy.  Take a prenatal vitamin that contains at least 600 micrograms (mcg) of folic acid.  If you have trouble pooping (constipation), take medicine that will make your stool soft (stool softener) if your doctor approves. Eating and drinking   Eat regular, healthy meals.  Avoid raw meat and uncooked cheese.  If you get low calcium from the food you eat, talk to your doctor about taking a daily calcium supplement.  Eat four or five small meals rather than three large meals a day.  Avoid foods that are high in fat and sugars, such as fried and sweet foods.  To prevent constipation: ? Eat foods that are high in fiber, like fresh fruits and vegetables, whole grains, and beans. ? Drink enough fluids to keep your pee (urine) clear or pale yellow. Activity  Exercise only as told by your doctor. Stop exercising if you start to have cramps.  Avoid heavy lifting, wear low heels, and sit up straight.  Do not exercise if it is too hot, too humid, or if you are in a place of great height (high altitude).  You may continue to have sex unless your doctor tells you not to. Relieving pain and discomfort  Wear a good support bra if your breasts are tender.  Take frequent breaks and rest with your legs raised if you have leg cramps or low back pain.  Take warm water baths (sitz baths) to soothe pain or discomfort caused by hemorrhoids. Use hemorrhoid cream if your doctor approves.  If you develop puffy, bulging veins (varicose  veins) in your legs: ? Wear support hose or compression stockings as told by your doctor. ? Raise (elevate) your feet for 15 minutes, 3-4 times a day. ? Limit salt in your food. Safety  Wear your seat belt when driving.  Make a list of emergency phone numbers, including numbers for family, friends, the hospital, and police and fire departments. Preparing for your baby's arrival To prepare for the arrival of your baby:  Take prenatal classes.  Practice driving to the hospital.  Visit the hospital and tour the maternity area.  Talk to your work about taking leave once the baby comes.  Pack your hospital bag.  Prepare the baby's room.  Go to your doctor visits.  Buy a rear-facing car seat. Learn how to install it in your car. General instructions  Do not use hot tubs, steam rooms, or saunas.  Do not use any products that contain nicotine or tobacco, such as cigarettes and e-cigarettes. If you need help quitting, ask your doctor.  Do not drink alcohol.  Do not douche or use tampons or scented sanitary pads.  Do not cross your legs for long periods of time.  Do not travel for long distances unless you must. Only do so if your doctor says it is okay.  Visit your dentist if you have not gone during your pregnancy. Use a soft toothbrush to brush your teeth. Be gentle when you floss.  Avoid cat litter boxes and soil   used by cats. These carry germs that can cause birth defects in the baby and can cause a loss of your baby (miscarriage) or stillbirth.  Keep all your prenatal visits as told by your doctor. This is important. Contact a doctor if:  You are not sure if you are in labor or if your water has broken.  You are dizzy.  You have mild cramps or pressure in your lower belly.  You have a nagging pain in your belly area.  You continue to feel sick to your stomach, you throw up, or you have watery poop.  You have bad smelling fluid coming from your vagina.  You have  pain when you pee. Get help right away if:  You have a fever.  You are leaking fluid from your vagina.  You are spotting or bleeding from your vagina.  You have severe belly cramps or pain.  You lose or gain weight quickly.  You have trouble catching your breath and have chest pain.  You notice sudden or extreme puffiness (swelling) of your face, hands, ankles, feet, or legs.  You have not felt the baby move in over an hour.  You have severe headaches that do not go away with medicine.  You have trouble seeing.  You are leaking, or you are having a gush of fluid, from your vagina before you are 37 weeks.  You have regular belly spasms (contractions) before you are 37 weeks. Summary  The third trimester is from week 28 through week 40 (months 7 through 9). This time is when your unborn baby is growing very fast.  Follow your doctor's advice about medicine, food, and activity.  Get ready for the arrival of your baby by taking prenatal classes, getting all the baby items ready, preparing the baby's room, and visiting your doctor to be checked.  Get help right away if you are bleeding from your vagina, or you have chest pain and trouble catching your breath, or if you have not felt your baby move in over an hour. This information is not intended to replace advice given to you by your health care provider. Make sure you discuss any questions you have with your health care provider. Document Revised: 01/15/2019 Document Reviewed: 10/30/2016 Elsevier Patient Education  2020 [employer] Back Pain in Pregnancy Back pain during pregnancy is common. Back pain may be caused by several factors that are related to changes during your pregnancy. Follow these instructions at home: Managing pain, stiffness, and swelling      If directed, for sudden (acute) back pain, put ice on the painful area. ? Put ice in a plastic bag. ? Place a towel between your skin and the bag. ? Leave the  ice on for 20 minutes, 2-3 times per day.  If directed, apply heat to the affected area before you exercise. Use the heat source that your health care provider recommends, such as a moist heat pack or a heating pad. ? Place a towel between your skin and the heat source. ? Leave the heat on for 20-30 minutes. ? Remove the heat if your skin turns bright red. This is especially important if you are unable to feel pain, heat, or cold. You may have a greater risk of getting burned.  If directed, massage the affected area. Activity  Exercise as told by your health care provider. Gentle exercise is the best way to prevent or manage back pain.  Listen to your body when lifting. If lifting hurts,   ask for help or bend your knees. This uses your leg muscles instead of your back muscles.  Squat down when picking up something from the floor. Do not bend over.  Only use bed rest for short periods as told by your health care provider. Bed rest should only be used for the most severe episodes of back pain. Standing, sitting, and lying down  Do not stand in one place for long periods of time.  Use good posture when sitting. Make sure your head rests over your shoulders and is not hanging forward. Use a pillow on your lower back if necessary.  Try sleeping on your side, preferably the left side, with a pregnancy support pillow or 1-2 regular pillows between your legs. ? If you have back pain after a night's rest, your bed may be too soft. ? A firm mattress may provide more support for your back during pregnancy. General instructions  Do not wear high heels.  Eat a healthy diet. Try to gain weight within your health care provider's recommendations.  Use a maternity girdle, elastic sling, or back brace as told by your health care provider.  Take over-the-counter and prescription medicines only as told by your health care provider.  Work with a physical therapist or massage therapist to find ways to  manage back pain. Acupuncture or massage therapy may be helpful.  Keep all follow-up visits as told by your health care provider. This is important. Contact a health care provider if:  Your back pain interferes with your daily activities.  You have increasing pain in other parts of your body. Get help right away if:  You develop numbness, tingling, weakness, or problems with the use of your arms or legs.  You develop severe back pain that is not controlled with medicine.  You have a change in bowel or bladder control.  You develop shortness of breath, dizziness, or you faint.  You develop nausea, vomiting, or sweating.  You have back pain that is a rhythmic, cramping pain similar to labor pains. Labor pain is usually 1-2 minutes apart, lasts for about 1 minute, and involves a bearing down feeling or pressure in your pelvis.  You have back pain and your water breaks or you have vaginal bleeding.  You have back pain or numbness that travels down your leg.  Your back pain developed after you fell.  You develop pain on one side of your back.  You see blood in your urine.  You develop skin blisters in the area of your back pain. Summary  Back pain may be caused by several factors that are related to changes during your pregnancy.  Follow instructions as told by your health care provider for managing pain, stiffness, and swelling.  Exercise as told by your health care provider. Gentle exercise is the best way to prevent or manage back pain.  Take over-the-counter and prescription medicines only as told by your health care provider.  Keep all follow-up visits as told by your health care provider. This is important. This information is not intended to replace advice given to you by your health care provider. Make sure you discuss any questions you have with your health care provider. Document Revised: 01/13/2019 Document Reviewed: 03/12/2018 Elsevier Patient Education  2020  [employer].

## 2020-04-21 NOTE — Progress Notes (Signed)
   PRENATAL VISIT NOTE  Subjective:  Sheri Hanna is a 29 y.o. [phone] at [redacted]w[redacted]d being seen today for ongoing prenatal care  She has received prenatal care in [place], and those records have been reviewed in detail. No chronic medical or obstetrical issues noted.  Antenatal genetic testing WNL.  Good dating noted.  She is currently monitored for the following issues for this low-risk pregnancy and has Supervision of other normal pregnancy, antepartum; Back pain complicating pregnancy, third trimester; and Uterine size date discrepancy pregnancy, third trimester on their problem list.  Patient doing well with no acute concerns today. She reports backache.  Contractions: Irregular. Vag. Bleeding: None.  Movement: Present. Denies leaking of fluid.   Pt declines tdap and states she didn't get it in the first pregnancy and doesn't want it now.  The following portions of the patient's history were reviewed and updated as appropriate: allergies, current medications, past family history, past medical history, past social history, past surgical history and problem list. Problem list updated.  Objective:   Vitals:   04/21/20 0912  BP: 117/78  Pulse: (!) 103  Weight: 172 lb (78 kg)    Fetal Status: Fetal Heart Rate (bpm): 140   Movement: Present     General:  Alert, oriented and cooperative. Patient is in no acute distress.  Skin: Skin is warm and dry. No rash noted.   Cardiovascular: Normal heart rate noted  Respiratory: Normal respiratory effort, no problems with respiration noted  Abdomen: Soft, gravid, appropriate for gestational age.  Pain/Pressure: Present     Pelvic: Cervical exam deferred        Extremities: Normal range of motion.     Mental Status:  Normal mood and affect. Normal behavior. Normal judgment and thought content.   Assessment and Plan:  Pregnancy: [id number] at [redacted]w[redacted]d  1. Back pain affecting pregnancy in third trimester Discussed tylenol rest and warm heat - Elastic  Bandages & Supports (COMFORT FIT MATERNITY SUPP LG) MISC; 1 Device by Does not apply route daily as needed.  Dispense: 1 each; Refill: 0  2. Uterine size date discrepancy pregnancy, third trimester Fundal height measures 34 cm and pt is 32 weeks, ? LGA versus increased AFI - [place] MFM OB DETAIL +14 WK; Future  3. Supervision of other normal pregnancy, antepartum  - Hepatitis C antibody   Preterm labor symptoms and general obstetric precautions including but not limited to vaginal bleeding, contractions, leaking of fluid and fetal movement were reviewed in detail with the patient.  Please refer to After Visit Summary for other counseling recommendations.   Return in about 2 weeks (around 05/05/2020) for ROB, in person.   Mariel Aloe, MD

## 2020-04-22 LAB — HEPATITIS C ANTIBODY: Hep C Virus Ab: <0.1 s/co ratio (ref 0.0–0.9)

## 2020-04-24 ENCOUNTER — Inpatient Hospital Stay (HOSPITAL_BASED_OUTPATIENT_CLINIC_OR_DEPARTMENT_OTHER): Payer: Medicaid Other

## 2020-04-24 ENCOUNTER — Inpatient Hospital Stay (HOSPITAL_COMMUNITY)
Admission: AD | Admit: 2020-04-24 | Discharge: 2020-04-24 | Disposition: A | Discharge status: Home / Self Care | Payer: Medicaid Other | Source: Home / Self Care | Attending: Obstetrics & Gynecology | Admitting: Obstetrics & Gynecology

## 2020-04-24 ENCOUNTER — Inpatient Hospital Stay (HOSPITAL_COMMUNITY)
Admission: AD | Admit: 2020-04-24 | Discharge: 2020-04-26 | DRG: 807 | Disposition: A | Discharge status: Home / Self Care | Payer: Medicaid Other | Attending: Obstetrics & Gynecology | Admitting: Obstetrics & Gynecology

## 2020-04-24 ENCOUNTER — Encounter (HOSPITAL_COMMUNITY): Payer: Self-pay | Admitting: Obstetrics & Gynecology

## 2020-04-24 ENCOUNTER — Other Ambulatory Visit: Payer: Self-pay

## 2020-04-24 DIAGNOSIS — O26899 Other specified pregnancy related conditions, unspecified trimester: Secondary | ICD-10-CM

## 2020-04-24 DIAGNOSIS — O403XX0 Polyhydramnios, third trimester, not applicable or unspecified: Secondary | ICD-10-CM | POA: Insufficient documentation

## 2020-04-24 DIAGNOSIS — Z3A32 32 weeks gestation of pregnancy: Secondary | ICD-10-CM

## 2020-04-24 DIAGNOSIS — O42013 Preterm premature rupture of membranes, onset of labor within 24 hours of rupture, third trimester: Secondary | ICD-10-CM | POA: Diagnosis not present

## 2020-04-24 DIAGNOSIS — O42919 Preterm premature rupture of membranes, unspecified as to length of time between rupture and onset of labor, unspecified trimester: Secondary | ICD-10-CM | POA: Diagnosis present

## 2020-04-24 DIAGNOSIS — O3663X0 Maternal care for excessive fetal growth, third trimester, not applicable or unspecified: Secondary | ICD-10-CM

## 2020-04-24 DIAGNOSIS — Z20822 Contact with and (suspected) exposure to covid-19: Secondary | ICD-10-CM | POA: Diagnosis present

## 2020-04-24 DIAGNOSIS — O99891 Other specified diseases and conditions complicating pregnancy: Secondary | ICD-10-CM

## 2020-04-24 DIAGNOSIS — Z87891 Personal history of nicotine dependence: Secondary | ICD-10-CM | POA: Insufficient documentation

## 2020-04-24 DIAGNOSIS — N939 Abnormal uterine and vaginal bleeding, unspecified: Secondary | ICD-10-CM

## 2020-04-24 DIAGNOSIS — O26893 Other specified pregnancy related conditions, third trimester: Secondary | ICD-10-CM

## 2020-04-24 DIAGNOSIS — R102 Pelvic and perineal pain: Secondary | ICD-10-CM | POA: Insufficient documentation

## 2020-04-24 DIAGNOSIS — R109 Unspecified abdominal pain: Secondary | ICD-10-CM | POA: Insufficient documentation

## 2020-04-24 DIAGNOSIS — M549 Dorsalgia, unspecified: Secondary | ICD-10-CM | POA: Insufficient documentation

## 2020-04-24 DIAGNOSIS — O42913 Preterm premature rupture of membranes, unspecified as to length of time between rupture and onset of labor, third trimester: Secondary | ICD-10-CM | POA: Diagnosis present

## 2020-04-24 DIAGNOSIS — O4693 Antepartum hemorrhage, unspecified, third trimester: Secondary | ICD-10-CM | POA: Insufficient documentation

## 2020-04-24 HISTORY — DX: Other specified health status: Z78.9

## 2020-04-24 LAB — CBC WITH DIFFERENTIAL/PLATELET
Abs Immature Granulocytes: 0.08 10*3/uL — ABNORMAL HIGH (ref 0.00–0.07)
Basophils Absolute: 0 10*3/uL (ref 0.0–0.1)
Basophils Relative: 0 %
Eosinophils Absolute: 0 10*3/uL (ref 0.0–0.5)
Eosinophils Relative: 1 %
HCT: 37.8 % (ref 36.0–46.0)
Hemoglobin: 12.7 g/dL (ref 12.0–15.0)
Immature Granulocytes: 1 %
Lymphocytes Relative: 12 %
Lymphs Abs: 0.9 10*3/uL (ref 0.7–4.0)
MCH: 30.5 pg (ref 26.0–34.0)
MCHC: 33.6 g/dL (ref 30.0–36.0)
MCV: 90.6 fL (ref 80.0–100.0)
Monocytes Absolute: 0.8 10*3/uL (ref 0.1–1.0)
Monocytes Relative: 10 %
Neutro Abs: 6.2 10*3/uL (ref 1.7–7.7)
Neutrophils Relative %: 76 %
Platelets: 222 10*3/uL (ref 150–400)
RBC: 4.17 MIL/uL (ref 3.87–5.11)
RDW: 13.7 % (ref 11.5–15.5)
WBC: 8 10*3/uL (ref 4.0–10.5)
nRBC: 0 % (ref 0.0–0.2)

## 2020-04-24 LAB — CBC
HCT: 38.5 % (ref 36.0–46.0)
Hemoglobin: 12.8 g/dL (ref 12.0–15.0)
MCH: 30 pg (ref 26.0–34.0)
MCHC: 33.2 g/dL (ref 30.0–36.0)
MCV: 90.4 fL (ref 80.0–100.0)
Platelets: 215 10*3/uL (ref 150–400)
RBC: 4.26 MIL/uL (ref 3.87–5.11)
RDW: 13.7 % (ref 11.5–15.5)
WBC: 8.4 10*3/uL (ref 4.0–10.5)
nRBC: 0 % (ref 0.0–0.2)

## 2020-04-24 LAB — WET PREP, GENITAL
Clue Cells Wet Prep HPF POC: NONE SEEN
Sperm: NONE SEEN
Trich, Wet Prep: NONE SEEN
Yeast Wet Prep HPF POC: NONE SEEN

## 2020-04-24 LAB — URINALYSIS, ROUTINE W REFLEX MICROSCOPIC
Bilirubin Urine: NEGATIVE
Glucose, UA: NEGATIVE mg/dL
Hgb urine dipstick: NEGATIVE
Ketones, ur: 20 mg/dL — AB
Leukocytes,Ua: NEGATIVE
Nitrite: NEGATIVE
Protein, ur: NEGATIVE mg/dL
Specific Gravity, Urine: 1.018 (ref 1.005–1.030)
pH: 7 (ref 5.0–8.0)

## 2020-04-24 LAB — TYPE AND SCREEN
ABO/RH(D): B POS
Antibody Screen: NEGATIVE

## 2020-04-24 LAB — SARS CORONAVIRUS 2 BY RT PCR (HOSPITAL ORDER, PERFORMED IN ~~LOC~~ HOSPITAL LAB): SARS Coronavirus 2: NEGATIVE

## 2020-04-24 LAB — ABO/RH: ABO/RH(D): B POS

## 2020-04-24 LAB — AMNISURE RUPTURE OF MEMBRANE (ROM) NOT AT ARMC: Amnisure ROM: POSITIVE

## 2020-04-24 MED ORDER — CEFTRIAXONE SODIUM 500 MG IJ SOLR: 250.0000 mg | Freq: Once | INTRAMUSCULAR | Status: DC

## 2020-04-24 MED ORDER — TERBUTALINE SULFATE 1 MG/ML IJ SOLN
0.2500 mg | Freq: Once | INTRAMUSCULAR | Status: AC
  Administered 2020-04-24: 0.25 mg via SUBCUTANEOUS
  Filled 2020-04-24: qty 1

## 2020-04-24 MED ORDER — ACETAMINOPHEN 325 MG PO TABS: 650.0000 mg | ORAL_TABLET | ORAL | Status: DC | PRN

## 2020-04-24 MED ORDER — AZITHROMYCIN 250 MG PO TABS
1000.0000 mg | ORAL_TABLET | Freq: Once | ORAL | Status: AC
  Administered 2020-04-24: 1000 mg via ORAL
  Filled 2020-04-24: qty 4

## 2020-04-24 MED ORDER — CALCIUM CARBONATE ANTACID 500 MG PO CHEW: 2.0000 | CHEWABLE_TABLET | ORAL | Status: DC | PRN

## 2020-04-24 MED ORDER — COMFORT FIT MATERNITY SUPP LG MISC: 1.0000 | Freq: Every day | 0 refills | Status: DC | PRN

## 2020-04-24 MED ORDER — BETAMETHASONE SOD PHOS & ACET 6 (3-3) MG/ML IJ SUSP
12.0000 mg | INTRAMUSCULAR | Status: DC
  Administered 2020-04-24: 12 mg via INTRAMUSCULAR
  Filled 2020-04-24: qty 5

## 2020-04-24 MED ORDER — DEXTROSE IN LACTATED RINGERS 5 % IV SOLN: INTRAVENOUS | Status: DC

## 2020-04-24 MED ORDER — PRENATAL MULTIVITAMIN CH
1.0000 | ORAL_TABLET | Freq: Every day | ORAL | Status: DC
  Administered 2020-04-25: 1 via ORAL
  Filled 2020-04-24: qty 1

## 2020-04-24 MED ORDER — CEFTRIAXONE SODIUM 500 MG IJ SOLR
500.0000 mg | Freq: Once | INTRAMUSCULAR | Status: AC
  Administered 2020-04-24: 500 mg via INTRAMUSCULAR
  Filled 2020-04-24: qty 500

## 2020-04-24 MED ORDER — ZOLPIDEM TARTRATE 5 MG PO TABS: 5.0000 mg | ORAL_TABLET | Freq: Every evening | ORAL | Status: DC | PRN

## 2020-04-24 MED ORDER — DOCUSATE SODIUM 100 MG PO CAPS
100.0000 mg | ORAL_CAPSULE | Freq: Every day | ORAL | Status: DC
  Administered 2020-04-24: 100 mg via ORAL
  Filled 2020-04-24: qty 1

## 2020-04-24 MED ORDER — AMOXICILLIN 500 MG PO CAPS: 500.0000 mg | ORAL_CAPSULE | Freq: Three times a day (TID) | ORAL | Status: DC

## 2020-04-24 MED ORDER — ENOXAPARIN SODIUM 40 MG/0.4ML ~~LOC~~ SOLN
40.0000 mg | SUBCUTANEOUS | Status: DC
  Administered 2020-04-24: 40 mg via SUBCUTANEOUS
  Filled 2020-04-24: qty 0.4

## 2020-04-24 MED ORDER — LIDOCAINE HCL (PF) 1 % IJ SOLN
INTRAMUSCULAR | Status: AC
  Administered 2020-04-24: 1 mL
  Filled 2020-04-24: qty 5

## 2020-04-24 MED ORDER — SODIUM CHLORIDE 0.9 % IV SOLN
2.0000 g | Freq: Four times a day (QID) | INTRAVENOUS | Status: DC
  Administered 2020-04-24 – 2020-04-25 (×2): 2 g via INTRAVENOUS
  Filled 2020-04-24 (×2): qty 2000

## 2020-04-24 MED ORDER — ACETAMINOPHEN 500 MG PO TABS
1000.0000 mg | ORAL_TABLET | Freq: Once | ORAL | Status: AC
  Administered 2020-04-24: 1000 mg via ORAL
  Filled 2020-04-24: qty 2

## 2020-04-24 MED ORDER — FENTANYL CITRATE (PF) 100 MCG/2ML IJ SOLN
50.0000 ug | Freq: Once | INTRAMUSCULAR | Status: AC
  Administered 2020-04-24: 50 ug via INTRAVENOUS
  Filled 2020-04-24: qty 2

## 2020-04-24 NOTE — MAU Provider Note (Signed)
Chief Complaint:  Abdominal Pain and Vaginal Bleeding   First Provider Initiated Contact with Patient 04/24/20 1022     HPI: Sheri Hanna is a 29 y.o. [id number] at [redacted]w[redacted]d who presents to maternity admissions reporting lower abd cramping and spotting that began yesterday and has progressively gotten worse. Last SI June 19. Denies vaginal discharge, leaking of fluid, decreased fetal movement, fever, falls, or recent illness.   Pregnancy Course: Transfer here from [place]  No chronic illnesses or surgeries.  OB History  Gravida Para Term Preterm AB Living  8 1 1   6 1   SAB TAB Ectopic Multiple Live Births    6     1    # Outcome Date GA Lbr Len/2nd Weight Sex Delivery Anes PTL Lv  8 Current           7 Term 04/17/15    M Vag-Spont EPI    6 TAB           5 TAB           4 TAB           3 TAB           2 TAB           1 TAB            Past Surgical History:  Procedure Laterality Date   THERAPEUTIC ABORTION     WISDOM TOOTH EXTRACTION     Family History  Problem Relation Age of Onset   Hypertension Mother    Diabetes Maternal Grandmother    Social History   Tobacco Use   Smoking status: Former Smoker   Smokeless tobacco: Never Used   Tobacco comment: none since + UPT  Substance Use Topics   Alcohol use: Not Currently   Drug use: Never   No Known Allergies No medications prior to admission.    I have reviewed patient's Past Medical Hx, Surgical Hx, Family Hx, Social Hx, medications and allergies.   ROS:  Review of Systems  Constitutional: Negative for fever.  HENT:       Pt has a cold   Eyes: Negative for photophobia and visual disturbance.  Respiratory: Negative for cough and shortness of breath.   Gastrointestinal: Negative for anal bleeding, blood in stool, nausea and vomiting. Constipation: has not had a BM in a week, but doesn't feel constipated.  Genitourinary: Positive for pelvic pain (cramping + pubic bone pain, has not picked up maternity belt) and  vaginal bleeding. Negative for vaginal discharge.  All other systems reviewed and are negative.   Physical Exam   Patient Vitals for the past 24 hrs:  BP Temp Temp src Pulse Resp SpO2 Height Weight  04/24/20 1400 123/66 -- -- (!) 104 17 98 % -- --  04/24/20 1353 123/66 -- -- (!) 104 -- -- -- --  04/24/20 0955 126/79 98.7 F (37.1 C) Oral (!) 112 16 98 % -- --  04/24/20 0952 -- -- -- -- -- -- 5\' 2"  (1.575 m) 173 lb 3.2 oz (78.6 kg)    Constitutional: Well-developed, well-nourished female in no acute distress.  Cardiovascular: normal rate & rhythm, no murmur Respiratory: normal effort, lung sounds clear throughout GI: Abd soft, non-tender, gravid appropriate for gestational age. Pos BS x 4 MS: Extremities nontender, no edema, normal ROM Neurologic: Alert and oriented x 4.  Pelvic: NEFG, copious blood-tinged yellow discharge,blood visualized near cervix, no CMT, cervix closed.     Fetal Tracing: reactive  Baseline: 140 Variability: moderate Accelerations: present Decelerations: none Toco: occasional   Labs: Results for orders placed or performed during the hospital encounter of 04/24/20 (from the past 24 hour(s))  Wet prep, genital     Status: Abnormal   Collection Time: 04/24/20 10:25 AM   Specimen: Cervix  Result Value Ref Range   Yeast Wet Prep HPF POC NONE SEEN NONE SEEN   Trich, Wet Prep NONE SEEN NONE SEEN   Clue Cells Wet Prep HPF POC NONE SEEN NONE SEEN   WBC, Wet Prep HPF POC MANY (A) NONE SEEN   Sperm NONE SEEN   CBC with Differential/Platelet     Status: Abnormal   Collection Time: 04/24/20 10:56 AM  Result Value Ref Range   WBC 8.0 4.0 - 10.5 K/uL   RBC 4.17 3.87 - 5.11 MIL/uL   Hemoglobin 12.7 12.0 - 15.0 g/dL   HCT 16.1 36 - 46 %   MCV 90.6 80.0 - 100.0 fL   MCH 30.5 26.0 - 34.0 pg   MCHC 33.6 30.0 - 36.0 g/dL   RDW 09.6 04.5 - 40.9 %   Platelets 222 150 - 400 K/uL   nRBC 0.0 0.0 - 0.2 %   Neutrophils Relative % 76 %   Neutro Abs 6.2 1.7 - 7.7 K/uL    Lymphocytes Relative 12 %   Lymphs Abs 0.9 0.7 - 4.0 K/uL   Monocytes Relative 10 %   Monocytes Absolute 0.8 0 - 1 K/uL   Eosinophils Relative 1 %   Eosinophils Absolute 0.0 0 - 0 K/uL   Basophils Relative 0 %   Basophils Absolute 0.0 0 - 0 K/uL   Immature Granulocytes 1 %   Abs Immature Granulocytes 0.08 (H) 0.00 - 0.07 K/uL  ABO/Rh     Status: None   Collection Time: 04/24/20 10:56 AM  Result Value Ref Range   ABO/RH(D) B POS    No rh immune globuloin      NOT A RH IMMUNE GLOBULIN CANDIDATE, PT RH POSITIVE Performed at [facility], [address], [place], [place] [address]   Urinalysis, Routine w reflex microscopic     Status: Abnormal   Collection Time: 04/24/20 10:58 AM  Result Value Ref Range   Color, Urine YELLOW YELLOW   APPearance HAZY (A) CLEAR   Specific Gravity, Urine 1.018 1.005 - 1.030   pH 7.0 5.0 - 8.0   Glucose, UA NEGATIVE NEGATIVE mg/dL   Hgb urine dipstick NEGATIVE NEGATIVE   Bilirubin Urine NEGATIVE NEGATIVE   Ketones, ur 20 (A) NEGATIVE mg/dL   Protein, ur NEGATIVE NEGATIVE mg/dL   Nitrite NEGATIVE NEGATIVE   Leukocytes,Ua NEGATIVE NEGATIVE    Imaging:  [place] MFM OB LIMITED  Result Date: 04/24/2020 ----------------------------------------------------------------------  OBSTETRICS REPORT                       (Signed Final 04/24/2020 02:24 pm) ---------------------------------------------------------------------- Patient Info  ID #:       [id number]                          D.O.B.:  1991/01/09 (29 yrs)  Name:       Sheri Hanna                 Visit Date: 04/24/2020 10:43 am ---------------------------------------------------------------------- Performed By  Attending:        Lin Landsman      Referred By:  [facility] MAU/Triage                    MD  Performed By:     Elita Quick Summer,            [facility]  ---------------------------------------------------------------------- Orders  #  Description                           Code        Ordered By  1  [place] MFM OB LIMITED                     76815.01    Clemencia Helzer ----------------------------------------------------------------------  #  Order #                     Accession #                Episode #  1  [id number]                   [id number]                 [id number] ---------------------------------------------------------------------- Indications  [redacted] weeks gestation of pregnancy                 Z3A.32  Pelvic pain affecting pregnancy in third        O26.893  trimester  Large for gestational age fetus affecting       O36.60X0  management of mother ---------------------------------------------------------------------- Fetal Evaluation  Num Of Fetuses:          1  Fetal Heart Rate(bpm):   135  Cardiac Activity:        Observed  Presentation:            Cephalic  Placenta:                Posterior  P. Cord Insertion:       Visualized, central  Amniotic Fluid  AFI FV:      Polyhydramnios  AFI Sum(cm)     %Tile       Largest Pocket(cm)  34.6            > 97        12.4  RUQ(cm)       RLQ(cm)       LUQ(cm)        LLQ(cm)  12.4          5.7           9.8            6.7 ---------------------------------------------------------------------- Biometry  BPD:        85  mm     G. Age:  34w 2d         87  % ---------------------------------------------------------------------- OB History  Gravidity:    8         Term:   1        Prem:   0        SAB:   0  TOP:          6       Ectopic:  0        Living: 1 ---------------------------------------------------------------------- Gestational Age  Clinical EDD:  32w 4d                                        EDD:   06/15/20  U/S Today:     34w 2d                                        EDD:   06/03/20  Best:          32w 4d     Det. By:  Clinical EDD             EDD:   06/15/20  ---------------------------------------------------------------------- Anatomy  Cranium:               Appears normal         Lips:                   Appears normal  Cavum:                 Appears normal         Stomach:                Appears normal, left                                                                        sided  Ventricles:            Appears normal         Abdomen:                Dilated bowel  Choroid Plexus:        Appears normal         Kidneys:                Appear normal  Face:                  Profile appears        Bladder:                Appears normal                         normal ---------------------------------------------------------------------- Impression  Limited exam due pelvic pain  There is mild bowel dilation with polyhydramnios.  Recent transfer of care for [place]. ---------------------------------------------------------------------- Recommendations  Consider weekly testing  MFM consultation and anatomy exam if not previously  performed.  Growth at first available.  Genetic screeing if not performed previously. ----------------------------------------------------------------------               Lin Landsman, MD Electronically Signed Final Report   04/24/2020 02:24 pm ----------------------------------------------------------------------   MAU Course: Orders Placed This Encounter  Procedures   Wet prep, genital   [place] MFM OB LIMITED   Urinalysis, Routine w reflex microscopic  CBC with Differential/Platelet   ABO/Rh   Discharge patient   Meds ordered this encounter  Medications   azithromycin (ZITHROMAX) tablet 1,000 mg   DISCONTD: cefTRIAXone (ROCEPHIN) injection 250 mg    Order Specific Question:   Antibiotic Indication:    Answer:   STD   cefTRIAXone (ROCEPHIN) injection 500 mg    Order Specific Question:   Antibiotic Indication:    Answer:   STD   Elastic Bandages & Supports (COMFORT FIT MATERNITY SUPP LG) MISC    Sig: 1 Device by  Does not apply route daily as needed.    Dispense:  1 each    Refill:  0    Order Specific Question:   Supervising Provider    Answer:   Reva BoresPRATT, TANYA S [[id number]]   lidocaine (PF) (XYLOCAINE) 1 % injection    Abigail ButtsMontgomery, Kathy   : cabinet override   acetaminophen (TYLENOL) tablet 1,000 mg    MDM: CBC - normal UA - normal Wet prep - many WBCs seen  GC/CT pending - pt not confident her last sexual partner did not give her gc/ct, he was "accused of giving someone else something". Empirically treated for gc/ct in MAU.  Ultrasound found polyhydramnios - pt already has a MFM detailed U/S scheduled for 04/29/20. Given very specific preterm labor precautions and discussed risks of polyhydramnios. Dr. Donavan FoilBass updated.  Pt requested Tylenol prior to discharge for a headache, expressed relief after administration.  Assessment: 1. Polyhydramnios affecting pregnancy in third trimester   2. Vaginal bleeding   3. Cramping affecting pregnancy, antepartum   4. Back pain affecting pregnancy in third trimester     Plan: Discharge home in stable condition.  Preterm and DFM precautions given, pt verbalized understanding. MFM appt on 04/29/20    Follow-up Information    [facility]. Go to.   Specialty: Obstetrics and Gynecology Why: as scheduled for ongoing prenatal care, repeat ultrasound on 04/29/20 Contact information: [address], [address] [place] [id number] [phone]              Allergies as of 04/24/2020   No Known Allergies     Medication List    TAKE these medications   Comfort Fit Maternity Supp Lg Misc 1 Device by Does not apply route daily as needed.   prenatal multivitamin Tabs tablet Take 1 tablet by mouth daily at 12 noon.       Bernerd LimboWalker, Braiden Rodman R, [place] 04/24/2020 4:42 PM

## 2020-04-24 NOTE — H&P (Signed)
Sheri Hanna is a 29 y.o. female presenting for PPROM at 1900 hours today. She endorses new abdominal  Cramping, onset coinciding with PPROM. She rates her pain as 5/10. She denies vaginal bleeding,decreased fetal movement, fever, falls, or recent illness.   Patient recently transferred care from [place] to [facility] ( s/p one visit). She endorses uncomplicated term SVD in 2016.  OB History    Gravida  8   Para  1   Term  1   Preterm      AB  6   Living  1     SAB      TAB  6   Ectopic      Multiple      Live Births  1          No past medical history on file. Past Surgical History:  Procedure Laterality Date   THERAPEUTIC ABORTION     WISDOM TOOTH EXTRACTION     Family History: family history includes Diabetes in her maternal grandmother; Hypertension in her mother. Social History:  reports that she has quit smoking. She has never used smokeless tobacco. She reports previous alcohol use. She reports that she does not use drugs.     Maternal Diabetes: No Genetic Screening: Normal  Maternal Ultrasounds/Referrals: Other: Polyhydramnios Fetal Ultrasounds or other Referrals:  Referred to Materal Fetal Medicine  Maternal Substance Abuse:  No Significant Maternal Medications:  None Significant Maternal Lab Results:  None Other Comments:  None  Review of Systems  Gastrointestinal: Positive for abdominal pain.  Genitourinary: Positive for vaginal discharge.  Musculoskeletal: Positive for back pain.  All other systems reviewed and are negative.     Blood pressure 133/87, pulse (!) 118, temperature 99.4 F (37.4 C), temperature source Oral, resp. rate 17, last menstrual period 09/15/2019, SpO2 99 %. Physical Exam Vitals and nursing note reviewed. Exam conducted with a chaperone present.  Cardiovascular:     Rate and Rhythm: Tachycardia present.     Pulses: Normal pulses.     Heart sounds: Normal heart sounds.  Pulmonary:     Effort: Pulmonary effort  is normal.  Abdominal:     Comments: Gravid  Skin:    General: Skin is warm and dry.     Capillary Refill: Capillary refill takes less than 2 seconds.  Psychiatric:        Mood and Affect: Mood normal.     Prenatal labs: ABO, Rh: --/--/B POS (07/18 1056) Antibody: Negative (06/08 0000) Rubella: Immune (06/08 0000) RPR: Nonreactive (06/08 0000)  HBsAg:    HIV:    GBS:     Fetal Surveillence --Baseline 135, mod variability, pos 10 x 10 accels --Toco: irregular contractions q 4-5 min --Malfunctioning monitor in MAU at 2044, FHT 145 via Doppler  Assessment/Plan: --29 y.o. [id number] at [redacted]w[redacted]d  --Grossly ruptured at 1900 hours today --Scant meconium noted on pad --Cervix 1/thick/posterior --Vertex confirmed with bedside ultrasound --Polyhydramnios per MFM note today --Per Dr. Despina Hidden, admit to [facility], CNM 04/24/2020, 9:19 PM

## 2020-04-24 NOTE — Discharge Instructions (Signed)
Polyhydramnios When a woman becomes pregnant, a sac forms around her growing baby. This sac, called the amniotic sac, is filled with fluid (amniotic fluid) that:  Cushions and protects the baby.  Helps the baby's lungs and gastrointestinal tract grow.  Allows the baby to move around, which helps the baby's muscles and bones develop. Polyhydramnios means that there is too much fluid in the sac. Babies born with polyhydramnios should be checked for birth defects. What are the causes? This condition may be caused by:  Diabetes in the mother.  The baby being larger than normal for the baby's age (large for gestational age).  Problems that prevent the fetus from swallowing amniotic fluid. These may include: ? An abnormal chromosome. ? Abnormality in the baby's intestinal tract. ? A birth defect in which the baby does not have a brain or parts of the brain (anencephaly).  A condition that affects twins, called twin-twin transfusion syndrome.  An illness in the mother, such as kidney or heart disease.  A tumor of the placenta (chorioangioma).  An infection in the baby. What are the signs or symptoms? Symptoms of this condition include:  A uterus that is larger than it should be for the stage of pregnancy.  Shortness of breath.  Increased feeling of pressure on the abdomen.  Increased swelling in the legs.  Preterm labor.  Preeclampsia. How is this diagnosed? This condition may be diagnosed based on:  A measurement of your abdomen. Your health care provider can diagnose the condition if he or she measures you and notices that your uterus is larger than it should be.  An ultrasound. This image may be taken by placing a device on your abdomen or into your vagina. The test can measure the amount of fluid in the amniotic sac (amniotic fluid index). It can also: ? Show if you are carrying twins or more. ? Measure the growth of the baby. ? Look for birth defects. How is this  treated? This condition is treated by removing fluid from the amniotic sac. This is done through a procedure where a needle is inserted through the skin into the uterus (amniocentesis). Follow these instructions at home:  Make sure to keep any medical conditions you have under control. If you have diabetes, talk to your health care provider about ways to manage your blood sugar.  Keep all your prenatal visits as told by your health care provider. It is important to follow your health care provider's recommendations. Contact a health care provider if:  You think your uterus has grown too fast in a short period of time.  You feel a great amount of pressure in your pelvis and are more uncomfortable than expected. Get help right away if:  You have a gush of fluid or are leaking fluid from your vagina.  You stop feeling the baby move.  You do not feel the baby kicking as much as usual.  You have diabetes and you have a hard time keeping it under control.  You have kidney or heart disease and it is causing you problems. Summary  Polyhydramnios means there is too much fluid in the amniotic sac. This can lead to birth defects.  This condition may be diagnosed based on a measurement of your abdomen or an ultrasound.  Keep all your prenatal visits as told by your health care provider. It is important to follow your health care provider's recommendations. This information is not intended to replace advice given to you by your health   care provider. Make sure you discuss any questions you have with your health care provider. Document Revised: 09/06/2017 Document Reviewed: 12/11/2016 Elsevier Patient Education  2020 [employer].

## 2020-04-24 NOTE — Progress Notes (Signed)
From 2044-2105 FHR was not tracing, FHM was making "weird" noise. Doppler - 141

## 2020-04-24 NOTE — MAU Note (Signed)
Sheri Hanna is a 29 y.o. at [redacted]w[redacted]d here in MAU reporting: had some spotting this morning, states it started as light pink and then it started to get brighter. Also started having some lower abdominal cramps last night. Has been having BH. No LOF. +FM  Onset of complaint: last night  Pain score: 7/10  Vitals:   04/24/20 0955  BP: 126/79  Pulse: (!) 112  Resp: 16  Temp: 98.7 F (37.1 C)  SpO2: 98%     FHT: +FM  Lab orders placed from triage: UA

## 2020-04-24 NOTE — MAU Note (Signed)
Possible PROM at 1900, clear fluid. Pt was here this morning with spotting.  Positive FM, low risk pregnancy.

## 2020-04-25 ENCOUNTER — Inpatient Hospital Stay (HOSPITAL_COMMUNITY): Payer: Medicaid Other | Admitting: Anesthesiology

## 2020-04-25 ENCOUNTER — Encounter (HOSPITAL_COMMUNITY): Payer: Self-pay | Admitting: Obstetrics & Gynecology

## 2020-04-25 DIAGNOSIS — Z3A32 32 weeks gestation of pregnancy: Secondary | ICD-10-CM

## 2020-04-25 DIAGNOSIS — O42013 Preterm premature rupture of membranes, onset of labor within 24 hours of rupture, third trimester: Secondary | ICD-10-CM

## 2020-04-25 LAB — GC/CHLAMYDIA PROBE AMP (~~LOC~~) NOT AT ARMC
Chlamydia: NEGATIVE
Comment: NEGATIVE
Comment: NORMAL
Neisseria Gonorrhea: NEGATIVE

## 2020-04-25 MED ORDER — SOD CITRATE-CITRIC ACID 500-334 MG/5ML PO SOLN: 30.0000 mL | ORAL | Status: DC | PRN

## 2020-04-25 MED ORDER — ACETAMINOPHEN 325 MG PO TABS
650.0000 mg | ORAL_TABLET | ORAL | Status: DC | PRN
  Administered 2020-04-25: 650 mg via ORAL
  Filled 2020-04-25: qty 2

## 2020-04-25 MED ORDER — PHENYLEPHRINE 40 MCG/ML (10ML) SYRINGE FOR IV PUSH (FOR BLOOD PRESSURE SUPPORT)
80.0000 ug | PREFILLED_SYRINGE | INTRAVENOUS | Status: DC | PRN
  Filled 2020-04-25: qty 10

## 2020-04-25 MED ORDER — IBUPROFEN 600 MG PO TABS
600.0000 mg | ORAL_TABLET | Freq: Four times a day (QID) | ORAL | Status: DC
  Administered 2020-04-25 – 2020-04-26 (×5): 600 mg via ORAL
  Filled 2020-04-25 (×5): qty 1

## 2020-04-25 MED ORDER — PHENYLEPHRINE 40 MCG/ML (10ML) SYRINGE FOR IV PUSH (FOR BLOOD PRESSURE SUPPORT): 80.0000 ug | PREFILLED_SYRINGE | INTRAVENOUS | Status: DC | PRN

## 2020-04-25 MED ORDER — ONDANSETRON HCL 4 MG PO TABS: 4.0000 mg | ORAL_TABLET | ORAL | Status: DC | PRN

## 2020-04-25 MED ORDER — OXYCODONE HCL 5 MG PO TABS: 10.0000 mg | ORAL_TABLET | ORAL | Status: DC | PRN

## 2020-04-25 MED ORDER — DIPHENHYDRAMINE HCL 50 MG/ML IJ SOLN: 12.5000 mg | INTRAMUSCULAR | Status: DC | PRN

## 2020-04-25 MED ORDER — MAGNESIUM HYDROXIDE 400 MG/5ML PO SUSP
30.0000 mL | ORAL | Status: DC | PRN
  Administered 2020-04-25: 30 mL via ORAL
  Filled 2020-04-25: qty 30

## 2020-04-25 MED ORDER — LACTATED RINGERS IV SOLN
500.0000 mL | Freq: Once | INTRAVENOUS | Status: AC
  Administered 2020-04-25: 500 mL via INTRAVENOUS

## 2020-04-25 MED ORDER — SIMETHICONE 80 MG PO CHEW: 80.0000 mg | CHEWABLE_TABLET | ORAL | Status: DC | PRN

## 2020-04-25 MED ORDER — FENTANYL CITRATE (PF) 100 MCG/2ML IJ SOLN
INTRAMUSCULAR | Status: AC
  Filled 2020-04-25: qty 2

## 2020-04-25 MED ORDER — FENTANYL CITRATE (PF) 100 MCG/2ML IJ SOLN
100.0000 ug | INTRAMUSCULAR | Status: DC | PRN
  Administered 2020-04-25 (×2): 100 ug via INTRAVENOUS
  Filled 2020-04-25: qty 2

## 2020-04-25 MED ORDER — LACTATED RINGERS IV SOLN: 500.0000 mL | INTRAVENOUS | Status: DC | PRN

## 2020-04-25 MED ORDER — MAGNESIUM SULFATE BOLUS VIA INFUSION
5.0000 g | Freq: Once | INTRAVENOUS | Status: AC
  Administered 2020-04-25: 5 g via INTRAVENOUS
  Filled 2020-04-25: qty 1000

## 2020-04-25 MED ORDER — WITCH HAZEL-GLYCERIN EX PADS
1.0000 "application " | MEDICATED_PAD | CUTANEOUS | Status: DC | PRN
  Administered 2020-04-25: 1 via TOPICAL
  Filled 2020-04-25: qty 100

## 2020-04-25 MED ORDER — PRENATAL MULTIVITAMIN CH
1.0000 | ORAL_TABLET | Freq: Every day | ORAL | Status: DC
  Administered 2020-04-25 – 2020-04-26 (×2): 1 via ORAL
  Filled 2020-04-25 (×2): qty 1

## 2020-04-25 MED ORDER — FENTANYL CITRATE (PF) 100 MCG/2ML IJ SOLN
50.0000 ug | Freq: Once | INTRAMUSCULAR | Status: AC
  Administered 2020-04-25: 50 ug via INTRAVENOUS
  Filled 2020-04-25: qty 2

## 2020-04-25 MED ORDER — LACTATED RINGERS IV BOLUS: 1000.0000 mL | Freq: Once | INTRAVENOUS | Status: DC

## 2020-04-25 MED ORDER — COCONUT OIL OIL: 1.0000 "application " | TOPICAL_OIL | Status: DC | PRN

## 2020-04-25 MED ORDER — OXYTOCIN-SODIUM CHLORIDE 30-0.9 UT/500ML-% IV SOLN
2.5000 [IU]/h | INTRAVENOUS | Status: DC
  Filled 2020-04-25: qty 500

## 2020-04-25 MED ORDER — ONDANSETRON HCL 4 MG/2ML IJ SOLN: 4.0000 mg | INTRAMUSCULAR | Status: DC | PRN

## 2020-04-25 MED ORDER — BENZOCAINE-MENTHOL 20-0.5 % EX AERO: 1.0000 "application " | INHALATION_SPRAY | CUTANEOUS | Status: DC | PRN

## 2020-04-25 MED ORDER — MAGNESIUM SULFATE 40 GM/1000ML IV SOLN
2.0000 g/h | INTRAVENOUS | Status: DC
  Administered 2020-04-25: 2 g/h via INTRAVENOUS
  Filled 2020-04-25: qty 1000

## 2020-04-25 MED ORDER — TETANUS-DIPHTH-ACELL PERTUSSIS 5-2.5-18.5 LF-MCG/0.5 IM SUSP
0.5000 mL | Freq: Once | INTRAMUSCULAR | Status: AC
  Administered 2020-04-26: 0.5 mL via INTRAMUSCULAR
  Filled 2020-04-25: qty 0.5

## 2020-04-25 MED ORDER — FENTANYL-BUPIVACAINE-NACL 0.5-0.125-0.9 MG/250ML-% EP SOLN
12.0000 mL/h | EPIDURAL | Status: DC | PRN
  Filled 2020-04-25: qty 250

## 2020-04-25 MED ORDER — ONDANSETRON HCL 4 MG/2ML IJ SOLN: 4.0000 mg | Freq: Four times a day (QID) | INTRAMUSCULAR | Status: DC | PRN

## 2020-04-25 MED ORDER — OXYTOCIN BOLUS FROM INFUSION
333.0000 mL | Freq: Once | INTRAVENOUS | Status: AC
  Administered 2020-04-25: 333 mL via INTRAVENOUS

## 2020-04-25 MED ORDER — ACETAMINOPHEN 325 MG PO TABS: 650.0000 mg | ORAL_TABLET | ORAL | Status: DC | PRN

## 2020-04-25 MED ORDER — FENTANYL CITRATE (PF) 100 MCG/2ML IJ SOLN: 50.0000 ug | INTRAMUSCULAR | Status: DC | PRN

## 2020-04-25 MED ORDER — EPHEDRINE 5 MG/ML INJ: 10.0000 mg | INTRAVENOUS | Status: DC | PRN

## 2020-04-25 MED ORDER — LIDOCAINE HCL (PF) 1 % IJ SOLN: 30.0000 mL | INTRAMUSCULAR | Status: DC | PRN

## 2020-04-25 MED ORDER — LACTATED RINGERS IV SOLN: INTRAVENOUS | Status: DC

## 2020-04-25 MED ORDER — OXYCODONE HCL 5 MG PO TABS: 5.0000 mg | ORAL_TABLET | ORAL | Status: DC | PRN

## 2020-04-25 MED ORDER — DIBUCAINE (PERIANAL) 1 % EX OINT: 1.0000 "application " | TOPICAL_OINTMENT | CUTANEOUS | Status: DC | PRN

## 2020-04-25 MED ORDER — SENNOSIDES-DOCUSATE SODIUM 8.6-50 MG PO TABS
2.0000 | ORAL_TABLET | ORAL | Status: DC
  Administered 2020-04-25: 2 via ORAL
  Filled 2020-04-25: qty 2

## 2020-04-25 MED ORDER — DIPHENHYDRAMINE HCL 25 MG PO CAPS: 25.0000 mg | ORAL_CAPSULE | Freq: Four times a day (QID) | ORAL | Status: DC | PRN

## 2020-04-25 NOTE — Anesthesia Preprocedure Evaluation (Deleted)
Anesthesia Evaluation  Patient identified by MRN, date of birth, ID band Patient awake    Reviewed: Allergy & Precautions, NPO status , Patient's Chart, lab work & pertinent test results  Airway Mallampati: II  TM Distance: >3 FB Neck ROM: Full    Dental no notable dental hx.    Pulmonary neg pulmonary ROS, former smoker,    Pulmonary exam normal breath sounds clear to auscultation       Cardiovascular negative cardio ROS Normal cardiovascular exam Rhythm:Regular Rate:Normal     Neuro/Psych negative neurological ROS  negative psych ROS   GI/Hepatic negative GI ROS, Neg liver ROS,   Endo/Other  negative endocrine ROS  Renal/GU negative Renal ROS  negative genitourinary   Musculoskeletal negative musculoskeletal ROS (+)   Abdominal   Peds  Hematology negative hematology ROS (+)   Anesthesia Other Findings PPROM at 32/[redacted] weeks gestation. Received lovenox 40mg  7/18 at 2155  Reproductive/Obstetrics (+) Pregnancy                            Anesthesia Physical Anesthesia Plan  ASA: II  Anesthesia Plan: Epidural   Post-op Pain Management:    Induction:   PONV Risk Score and Plan: Treatment may vary due to age or medical condition  Airway Management Planned: Natural Airway  Additional Equipment:   Intra-op Plan:   Post-operative Plan:   Informed Consent: I have reviewed the patients History and Physical, chart, labs and discussed the procedure including the risks, benefits and alternatives for the proposed anesthesia with the patient or authorized representative who has indicated his/her understanding and acceptance.       Plan Discussed with: Anesthesiologist  Anesthesia Plan Comments: (Patient identified. Risks, benefits, options discussed with patient including but not limited to bleeding, infection, nerve damage, paralysis, failed block, incomplete pain control, headache,  blood pressure changes, nausea, vomiting, reactions to medication, itching, and post partum back pain. Confirmed with bedside nurse the patient's most recent platelet count. Confirmed with the patient that they are not taking any anticoagulation, have any bleeding history or any family history of bleeding disorders. Patient expressed understanding and wishes to proceed. All questions were answered. )        Anesthesia Quick Evaluation

## 2020-04-25 NOTE — Lactation Note (Signed)
This note was copied from a baby's chart. Lactation Consultation Note  Patient Name: Sheri Hanna [id number] Date: 04/25/2020   RN states that Ms. Trejos's feeding choice is formula.  Walker Shadow 04/25/2020, 3:30 PM

## 2020-04-25 NOTE — Plan of Care (Signed)
Patient was admitted from MAU due to  PPROM at 87.Upon arrival to unit large pad saturated with clear fluid no odor noted.EFM shows frequent UC's that patient rates an 8-9 but does not appear to be very uncomfortable. Patient was oriented to room, plan of care, reason for antibiotics,Lovenox, and Betamethasone explained to patient.

## 2020-04-25 NOTE — Discharge Summary (Signed)
Postpartum Discharge Summary      Patient Name: Sheri Hanna DOB: Feb 27, 1991 MRN: [id number]  Date of admission: 04/24/2020 Delivery date:04/25/2020  Delivering provider: Clarnce Flock  Date of discharge: 04/26/2020  Admitting diagnosis: Preterm premature rupture of membranes (PPROM) with unknown onset of labor [O42.919] Intrauterine pregnancy: [redacted]w[redacted]d    Secondary diagnosis:  Active Problems:   Preterm premature rupture of membranes (PPROM) with unknown onset of labor   Polyhydramnios affecting pregnancy in third trimester  Additional problems: None    Discharge diagnosis: Preterm Pregnancy Delivered                                              Post partum procedures: None Augmentation: N/A Complications: None  Hospital course: Onset of Labor With Vaginal Delivery      29y.o. yo [id number] admitted in Latent Labor on 04/24/2020. Patient had an uncomplicated labor course as follows: Patient arrived at 1 cm dilation [facility] After approximately 8 hours she started to feel contractions and was found to be 3-4 cm and was transferred to L&D, she then progressed quickly to fully dilated four hours later and had uncomplicated NSVD.  Membrane Rupture Time/Date: 7:00 PM ,04/24/2020   Delivery Method:Vaginal, Spontaneous  Episiotomy: None  Lacerations:  None  Patient had an uncomplicated postpartum course.  She is ambulating, tolerating a regular diet, passing flatus, and urinating well. Patient is discharged home in stable condition on 04/26/20.  Newborn Data: Birth date:04/25/2020  Birth time:7:53 AM  Gender:Female  Living status:Living  Apgars:4 ,7  Weight:2300 g   Magnesium Sulfate received: Yes: Neuroprotection BMZ received: Yes Rhophylac:N/A MMR:N/A T-DaP:Given postpartum Flu: N/A Transfusion:No  Physical exam  Vitals:   04/25/20 1733 04/25/20 2112 04/26/20 0517 04/26/20 0900  BP: 108/65 119/79 105/62 120/82   Pulse: 86 (!) 106 91 88  Resp: 18 18 20 19   Temp: 98.3 F (36.8 C) 98.2 F (36.8 C) 98.5 F (36.9 C) 97.8 F (36.6 C)  TempSrc: Oral Axillary Oral Oral  SpO2: 99% 100% 98% 99%  Weight:      Height:       General: alert, cooperative and no distress Lochia: appropriate Uterine Fundus: firm Incision: N/A DVT Evaluation: No evidence of DVT seen on physical exam. Labs: Lab Results  Component Value Date   WBC 8.4 04/24/2020   HGB 12.8 04/24/2020   HCT 38.5 04/24/2020   MCV 90.4 04/24/2020   PLT 215 04/24/2020   CMP Latest Ref Rng & Units 11/15/2019  Glucose 70 - 99 mg/dL 91  BUN 6 - 20 mg/dL 8  Creatinine 0.44 - 1.00 mg/dL 0.57  Sodium 135 - 145 mmol/L 137  Potassium 3.5 - 5.1 mmol/L 4.1  Chloride 98 - 111 mmol/L 103  CO2 22 - 32 mmol/L 27  Calcium 8.9 - 10.3 mg/dL 9.8  Total Protein 6.5 - 8.1 g/dL 6.5  Total Bilirubin 0.3 - 1.2 mg/dL 0.2(L)  Alkaline Phos 38 - 126 U/L 34(L)  AST 15 - 41 U/L 15  ALT 0 - 44 U/L 12   Edinburgh Score: Edinburgh Postnatal Depression Scale Screening Tool 04/25/2020  I have been able to laugh and see the funny side of things. 0  I have looked forward with enjoyment to things. 0  I have blamed myself unnecessarily when things went wrong. 0  I have been anxious or worried for no good reason. 2  I have felt scared or panicky for no good reason. 0  Things have been getting on top of me. 1  I have been so unhappy that I have had difficulty sleeping. 0  I have felt sad or miserable. 0  I have been so unhappy that I have been crying. 0  The thought of harming myself has occurred to me. 0  Edinburgh Postnatal Depression Scale Total 3     After visit meds:  Allergies as of 04/26/2020   No Known Allergies     Medication List    TAKE these medications   acetaminophen 325 MG tablet Commonly known as: Tylenol Take 2 tablets (650 mg total) by mouth every 4 (four) hours as needed (for pain scale < 4).   Comfort Fit Maternity Supp Lg Misc 1  Device by Does not apply route daily as needed.   ibuprofen 600 MG tablet Commonly known as: ADVIL Take 1 tablet (600 mg total) by mouth every 6 (six) hours.   prenatal multivitamin Tabs tablet Take 1 tablet by mouth daily at 12 noon.        Discharge home in stable condition Infant Feeding: Bottle Infant Disposition:NICU Discharge instruction: per After Visit Summary and Postpartum booklet. Activity: Advance as tolerated. Pelvic rest for 6 weeks.  Diet: routine diet Future Appointments: Future Appointments  Date Time Provider [place]  05/09/2020  9:00 AM Lynnea Ferrier, LCSW [facility] None  06/06/2020 10:00 AM Constant, Vickii Chafe, MD [place] None   Follow up Visit:   Please schedule this patient for a In person postpartum visit in 6 weeks with the following provider: Any provider. Additional Postpartum F/U:Postpartum Depression checkup  High risk pregnancy complicated by: preterm delivery at 30 weeks Delivery mode:  Vaginal, Spontaneous  Anticipated Birth Control:  Nuvaring   04/26/2020 [place] Chagnon L Fausto Sampedro, DO

## 2020-04-26 LAB — CULTURE, BETA STREP (GROUP B ONLY)

## 2020-04-26 LAB — SURGICAL PATHOLOGY

## 2020-04-26 MED ORDER — ACETAMINOPHEN 325 MG PO TABS: 650.0000 mg | ORAL_TABLET | ORAL | 0 refills | Status: DC | PRN

## 2020-04-26 MED ORDER — IBUPROFEN 600 MG PO TABS: 600.0000 mg | ORAL_TABLET | Freq: Four times a day (QID) | ORAL | 0 refills | Status: DC

## 2020-04-26 NOTE — Plan of Care (Signed)
  Problem: Education: Goal: Knowledge of General Education information will improve Description: Including pain rating scale, medication(s)/side effects and non-pharmacologic comfort measures Outcome: Adequate for Discharge   Problem: Health Behavior/Discharge Planning: Goal: Ability to manage health-related needs will improve Outcome: Adequate for Discharge   Problem: Clinical Measurements: Goal: Ability to maintain clinical measurements within normal limits will improve Outcome: Adequate for Discharge Goal: Will remain free from infection Outcome: Adequate for Discharge Goal: Cardiovascular complication will be avoided Outcome: Adequate for Discharge   Problem: Activity: Goal: Risk for activity intolerance will decrease Outcome: Adequate for Discharge   Problem: Coping: Goal: Level of anxiety will decrease Outcome: Adequate for Discharge   Problem: Elimination: Goal: Will not experience complications related to bowel motility Outcome: Adequate for Discharge   Problem: Pain Managment: Goal: General experience of comfort will improve Outcome: Adequate for Discharge   Problem: Safety: Goal: Ability to remain free from injury will improve Outcome: Adequate for Discharge   Problem: Education: Goal: Knowledge of condition will improve Outcome: Adequate for Discharge Goal: Individualized Educational Video(s) Outcome: Adequate for Discharge Goal: Individualized Newborn Educational Video(s) Outcome: Adequate for Discharge   Problem: Activity: Goal: Will verbalize the importance of balancing activity with adequate rest periods Outcome: Adequate for Discharge Goal: Ability to tolerate increased activity will improve Outcome: Adequate for Discharge   Problem: Coping: Goal: Ability to identify and utilize available resources and services will improve Outcome: Adequate for Discharge   Problem: Life Cycle: Goal: Chance of risk for complications during the postpartum period  will decrease Outcome: Adequate for Discharge   Problem: Role Relationship: Goal: Ability to demonstrate positive interaction with newborn will improve Outcome: Adequate for Discharge   Problem: Skin Integrity: Goal: Demonstration of wound healing without infection will improve Outcome: Adequate for Discharge

## 2020-04-26 NOTE — Progress Notes (Signed)
Post Partum Day 1 Subjective: Patient reports feeling well. She is tolerating PO. Ambulating and urinating without difficulty. Lochia minimal.  Objective: Blood pressure 105/62, pulse 91, temperature 98.5 F (36.9 C), temperature source Oral, resp. rate 20, height 5\' 2"  (1.575 m), weight 78.6 kg, last menstrual period 09/15/2019, SpO2 98 %, unknown if currently breastfeeding.  Physical Exam:  General: alert, cooperative, appears stated age and no distress Lochia: appropriate Uterine Fundus: firm Incision: NA DVT Evaluation: No evidence of DVT seen on physical exam.  Recent Labs    04/24/20 1056 04/24/20 2035  HGB 12.7 12.8  HCT 37.8 38.5    Assessment/Plan: Plan for discharge tomorrow  Vitals stable Bottle feeding Desires Nuvaring at [facility] visit    LOS: 2 days   Shakai Dolley N Shawnta Zimbelman 04/26/2020, 6:21 AM

## 2020-04-26 NOTE — Discharge Instructions (Signed)

## 2020-04-26 NOTE — Progress Notes (Signed)
Discharge paperwork given, education complete, all questions answered, pt alert and oriented x4, pt ambulating, pt stable

## 2020-04-27 LAB — RPR: RPR Ser Ql: NONREACTIVE

## 2020-04-28 ENCOUNTER — Ambulatory Visit: Payer: Self-pay

## 2020-04-28 NOTE — Lactation Note (Signed)
This note was copied from a baby's chart. Lactation Consultation Note  Patient Name: Sheri Hanna Date: 04/28/2020  Sudie Grumbling, RN had called earlier to state that Mom's breasts are getting uncomfortable & Mom is interested in pumping. Mom is not in NICU room at this time, but RN has provided a DEBP kit. RN willing to show Mom how to use pump.   Lactation appt set up for tomorrow at 1300 unless that time does not work for [name]. Mom's feeding choice had initially been formula only.   Matthias Hughs [facility] 04/28/2020, 1:36 PM

## 2020-04-29 ENCOUNTER — Ambulatory Visit: Payer: Self-pay

## 2020-04-29 ENCOUNTER — Ambulatory Visit: Payer: Medicaid Other

## 2020-04-29 NOTE — Lactation Note (Signed)
This note was copied from a baby's chart. Lactation Consultation Note  Patient Name: Sheri Hanna [id number] Date: 04/29/2020 Reason for consult: Initial assessment;Mother's request;1st time breastfeeding;NICU baby;Preterm <34wks  LC in to visit with P2 Mom of preterm infant in the NICU.  Baby 76 days old and underwent exploratory laparotomy 2 days ago.  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  Baby is NPO and intubated on minimal settings on ventilator.   Mom has decided to initiate pumping due to baby having surgery and her breasts filling.  Mom was set up with DEBP yesterday and started pumping.  Today Mom expressed about 2 oz, but wishes to discard the milk due to having a cigarette yesterday and coffee this morning.  Talked about caffeine limits and tobacco use.  Mom advised that illicit drugs are not allowed when providing breast milk, but coffee and a cigarette shouldn't deter her from saving the milk.  Mom would rather "start fresh" with next pumping.    Mom instructed to pump on regular setting, both breasts at the same time, every 2-3 hrs during the day, and 3-4 hrs at night.  Shared with Mom that since she had a delay in starting to pump, her supply may be affected.  Encouraged her to stay consistent, storage bottles and breast milk labels provided.  Reviewed disassembling pump parts, washing, rinsing and air drying in separate bin provided on counter away from sink.    Mom doesn't have WIC, but is planning to apply.  Mom is rooming in currently.  Demonstrated how to use the hand pump if she needs to go home.    NICU booklet and lactation brochure given to Mom.  Mom knows she can call lactation or ask baby's RN to contact us prn.  Interventions Interventions: Breast feeding basics reviewed;DEBP  Lactation Tools Discussed/Used Tools: Pump Breast pump type: Double-Electric Breast Pump WIC Program: No Pump Review: Setup, frequency, and cleaning;Milk  Storage Initiated by:: RN Date initiated:: 04/28/20   Consult Status Consult Status: Follow-up Date: 04/30/20 Follow-up type: In-patient    [name] 04/29/2020, 2:34 PM

## 2020-05-05 ENCOUNTER — Encounter: Payer: Medicaid Other | Admitting: Obstetrics and Gynecology

## 2020-05-09 ENCOUNTER — Other Ambulatory Visit: Payer: Self-pay

## 2020-05-09 ENCOUNTER — Ambulatory Visit (INDEPENDENT_AMBULATORY_CARE_PROVIDER_SITE_OTHER): Payer: Medicaid Other | Admitting: Licensed Clinical Social Worker

## 2020-05-09 DIAGNOSIS — Z8759 Personal history of other complications of pregnancy, childbirth and the puerperium: Secondary | ICD-10-CM

## 2020-05-09 DIAGNOSIS — Z8659 Personal history of other mental and behavioral disorders: Secondary | ICD-10-CM | POA: Diagnosis not present

## 2020-05-09 NOTE — BH Specialist Note (Signed)
Integrated Behavioral Health Follow Up Visit  MRN: [id number] Name: Sheri Hanna  Number of Integrated Behavioral Health Clinician visits: 1 Session Start time: 9:06am   Session End time: 9:30am Total time: 24 mins face to face at [facility]   Type of Service: Integrated Behavioral Health- Individual Interpretor: Interpretor Name and Language: none   SUBJECTIVE: Sheri Hanna is a 29 y.o. female accompanied by n/a Patient was referred by hospital for premature birth . Patient reports the following symptoms/concerns:  Duration of problem: Since delivery ; Severity of problem: mild   OBJECTIVE: Mood: Good and Affect: Normal  Risk of harm to self or others: No risk of harm to self or others   LIFE CONTEXT: Family and Social: Lives with 5 yr old son/recently located to [place] from [employer]/Work: Unemployed  Self-Care: n/a  Life Changes: premature birth of daughter   GOALS ADDRESSED: Patient will: 1.  Reduce symptoms of: n 2.  Increase knowledge and/or ability of:  3.  Demonstrate ability to:   INTERVENTIONS: Interventions utilized:  Supportive counseling  Standardized Assessments completed:    Edinburgh Postnatal Depression Scale - 05/09/20 0918      Edinburgh Postnatal Depression Scale:  In the Past 7 Days   I have been able to laugh and see the funny side of things. 0    I have looked forward with enjoyment to things. 0    I have blamed myself unnecessarily when things went wrong. 1    I have been anxious or worried for no good reason. 0    I have felt scared or panicky for no good reason. 1    Things have been getting on top of me. 0    I have been so unhappy that I have had difficulty sleeping. 0    I have felt sad or miserable. 0    I have been so unhappy that I have been crying. 0    The thought of harming myself has occurred to me. 0    Edinburgh Postnatal Depression Scale Total 2          ASSESSMENT: Patient appears in good health and no apparent or  reports of distress. Sheri Hanna reports newborn daughter will remain in NICU till October. Sheri Hanna reports having an adequate support system involving FOB, maternal grandmother and sister. Sheri Hanna denies depressed mood, lack of bonding and crying.   Patient may benefit from community wraparound services   PLAN: 1. Follow up with behavioral health clinician on : as needed  2. Behavioral recommendations: Engage in self care activity, prioritize sleep  3. Referral(s): [facility]  4. "From scale of 1-10, how likely are you to follow plan?":   Gwyndolyn Saxon, LCSW

## 2020-05-19 ENCOUNTER — Ambulatory Visit: Payer: Self-pay

## 2020-05-19 NOTE — Lactation Note (Signed)
This note was copied from a baby's chart. Lactation Consultation Note  Patient Name: Sheri Hanna [id number] Date: 05/19/2020 Reason for consult: Follow-up assessment;NICU baby   Mom at bedside with infant in NICU.  Mom showed LC lactation cookies she has purchased.  States her breasts feel like they are getting softer and no longer get "hard".  She is pumping between 3-4 times daily.  Will sometimes get 2-3 ounces and other times 1 ounce total.  She explains she is stretched with time and trying to get in eating, sleeping, laundry, ect... Her son, 66yrs old, returns from [place] this week.  He begin school and mom is concerned about time allocation to him and pumping, and being in NICU.    LC praised mom for efforts and let her know the incredible benefits her milk provides her infant.  She showed LC the milk stored in the refrigerator.  LC discussed hands on pumping but mom describes having to hold the flanges.  LC used tube top like material to create a hands free bra for mom when pumping.  Mom placed top on and LC reviewed massaging and using compression when pumping in order to remove more milk.   All questions answered.  Mom denies further needs at this time.     Maternal Data    Feeding    LATCH Score                   Interventions Interventions: DEBP (LC made mom a hands free bra with binder)  Lactation Tools Discussed/Used Pump Review: Setup, frequency, and cleaning   Consult Status Consult Status: Follow-up Date: 05/20/20 Follow-up type: In-patient    Maryruth H[facility] 05/19/2020, 1:49 PM

## 2020-06-06 ENCOUNTER — Other Ambulatory Visit (HOSPITAL_COMMUNITY)
Admission: RE | Admit: 2020-06-06 | Discharge: 2020-06-06 | Disposition: A | Discharge status: Home / Self Care | Payer: Medicaid Other | Source: Ambulatory Visit | Attending: Obstetrics and Gynecology | Admitting: Obstetrics and Gynecology

## 2020-06-06 ENCOUNTER — Ambulatory Visit (INDEPENDENT_AMBULATORY_CARE_PROVIDER_SITE_OTHER): Payer: Medicaid Other | Admitting: Obstetrics and Gynecology

## 2020-06-06 ENCOUNTER — Encounter: Payer: Self-pay | Admitting: Obstetrics and Gynecology

## 2020-06-06 ENCOUNTER — Other Ambulatory Visit: Payer: Self-pay

## 2020-06-06 MED ORDER — ETONOGESTREL-ETHINYL ESTRADIOL 0.12-0.015 MG/24HR VA RING
VAGINAL_RING | VAGINAL | 12 refills | Status: DC
Start: 2020-06-06 — End: 2023-06-20

## 2020-06-06 NOTE — Progress Notes (Signed)
Post Partum Visit Note  Keisha Amer is a 29 y.o. [phone] female who presents for a postpartum visit. She is 6 week postpartum following a normal spontaneous vaginal delivery.  I have fully reviewed the prenatal and intrapartum course. The delivery was at [redacted]w[redacted]d gestational weeks.  Anesthesia: none. Postpartum course has been feeling better. Baby is in NICU - surgery schedule next week. Baby is feeding by feeding tube - donor milk. Bleeding no bleeding. Bowel function is normal. Bladder function is normal. Patient is not sexually active. Pt requests Nuva Ring. Contraception method is abstinence. Postpartum depression screening: negative. EPDS= 0    Edinburgh Postnatal Depression Scale - 06/06/20 1016      Edinburgh Postnatal Depression Scale:  In the Past 7 Days   I have been able to laugh and see the funny side of things. 0    I have looked forward with enjoyment to things. 0    I have blamed myself unnecessarily when things went wrong. 0    I have been anxious or worried for no good reason. 0    I have felt scared or panicky for no good reason. 0    Things have been getting on top of me. 0    I have been so unhappy that I have had difficulty sleeping. 0    I have felt sad or miserable. 0    I have been so unhappy that I have been crying. 0    The thought of harming myself has occurred to me. 0    Edinburgh Postnatal Depression Scale Total 0               Review of Systems Pertinent items noted in HPI and remainder of comprehensive ROS otherwise negative.    Objective:  Blood pressure 133/82, pulse 87, height 5\' 2"  (1.575 m), weight 153 lb 9.6 oz (69.7 kg), last menstrual period 09/15/2019, unknown if currently breastfeeding.  General:  alert, cooperative and no distress   Breasts:  inspection negative, no nipple discharge or bleeding, no masses or nodularity palpable  Lungs: clear to auscultation bilaterally  Heart:  regular rate and rhythm  Abdomen: soft, non-tender;  bowel sounds normal; no masses,  no organomegaly   Vulva:  normal  Vagina: normal vagina, no discharge, exudate, lesion, or erythema  Cervix:  multiparous appearance  Corpus: normal size, contour, position, consistency, mobility, non-tender  Adnexa:  normal adnexa and no mass, fullness, tenderness  Rectal Exam: Not performed.        Assessment:    Normal postpartum exam. Pap smear not done at today's visit.   Plan:   Essential components of care per ACOG recommendations:  1.  Mood and well being: Patient with negative depression screening today. Reviewed local resources for support.  - Patient does not use tobacco.  - hx of drug use? No    2. Infant care and feeding:  -Patient currently breastmilk feeding? Yes donor milk  -Social determinants of health (SDOH) reviewed in EPIC. No concerns  3. Sexuality, contraception and birth spacing - Patient does not want a pregnancy in the next year.   - Reviewed forms of contraception in tiered fashion. Patient desired NuvaRing vaginal inserts today.   - Discussed birth spacing of 18 months  4. Sleep and fatigue -Encouraged family/partner/community support of 4 hrs of uninterrupted sleep to help with mood and fatigue  5. Physical Recovery  - Discussed patients delivery and complications - Patient has urinary incontinence? No  -  Patient is safe to resume physical and sexual activity  6.  Health Maintenance - Last pap smear done 09/2019 in [place] and was normal with negative HPV. Vaginal swab today as patient reports vaginal odor without pruritis or discharge    Catalina Antigua, MD [facility], [facility]

## 2020-06-07 LAB — CERVICOVAGINAL ANCILLARY ONLY
Bacterial Vaginitis (gardnerella): POSITIVE — AB
Candida Glabrata: NEGATIVE
Candida Vaginitis: NEGATIVE
Comment: NEGATIVE
Comment: NEGATIVE
Comment: NEGATIVE

## 2020-06-07 MED ORDER — METRONIDAZOLE 500 MG PO TABS
500.0000 mg | ORAL_TABLET | Freq: Two times a day (BID) | ORAL | 0 refills | Status: DC
Start: 2020-06-07 — End: 2023-05-25

## 2020-06-07 NOTE — Addendum Note (Signed)
Addended by: Catalina Antigua on: 06/07/2020 01:15 PM   Modules accepted: Orders

## 2020-06-10 ENCOUNTER — Ambulatory Visit: Payer: Self-pay

## 2020-06-10 NOTE — Lactation Note (Signed)
This note was copied from a baby's chart. Lactation Consultation Note  Patient Name: Sheri Hanna Date: 06/10/2020 Reason for consult: Follow-up assessment;NICU baby  LC in to visit with P30 Mom of a 78 week old NICU baby.  Mom states she had stopped pumping a week ago due to her son returning from [place] and pumping was just too much.   Mom states her breasts have normalized.  Reassured her of the benefits of baby receiving her colostrum and early breast milk.  Mom knows she can ask for LC prn.    Consult Status Consult Status: Complete Date: 06/10/20 Follow-up type: Call as needed    [name] 06/10/2020, 12:07 PM

## 2020-10-21 IMAGING — US US OB COMP LESS 14 WK
1 series · 15 of 28 positions shown · non-contrast
Comparison: None.

CLINICAL DATA: Vaginal bleeding

EXAM:
OBSTETRIC <14 WK ULTRASOUND
TECHNIQUE: Transabdominal ultrasound was performed for evaluation of the
gestation as well as the maternal uterus and adnexal regions.

[Series 1: us ob comp less 14 wk · 34 acquisitions, 15 frames shown]
[im 1/34]
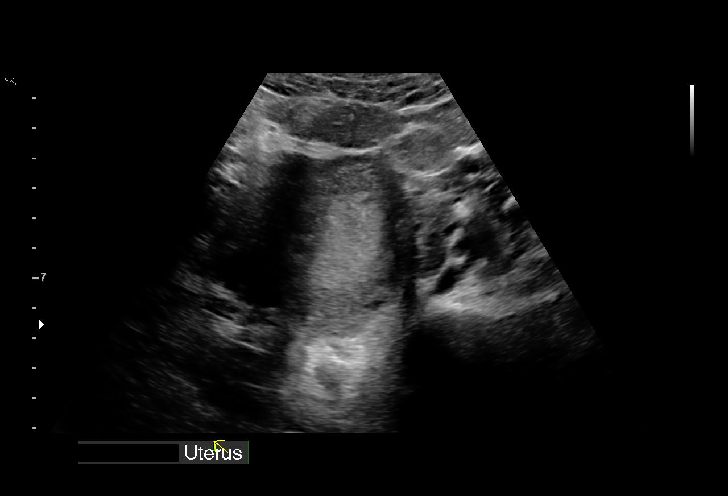
[im 3/34]
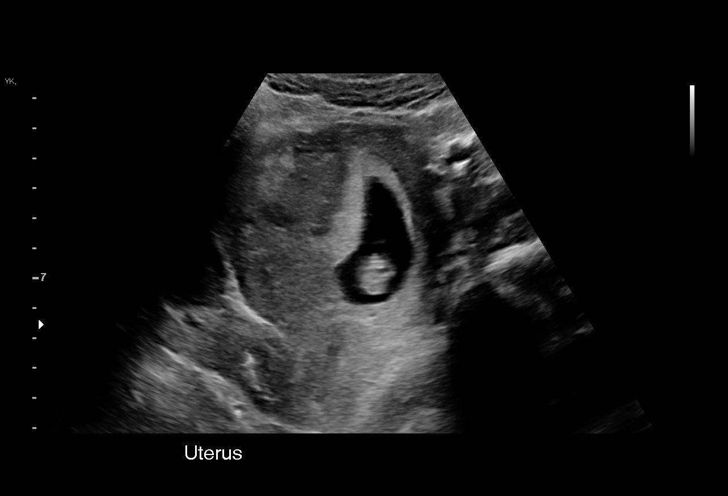
[im 5/34]
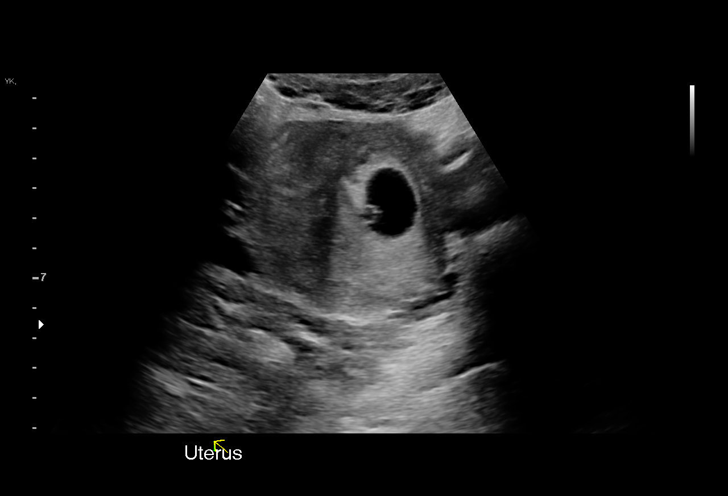
[im 8/34]
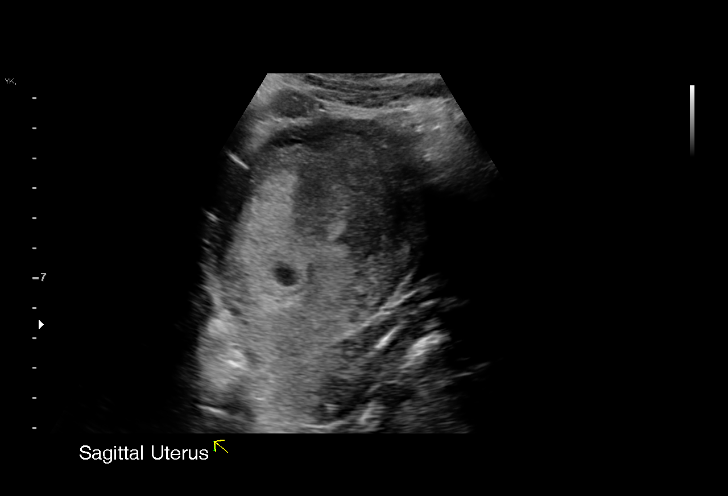
[im 10/34]
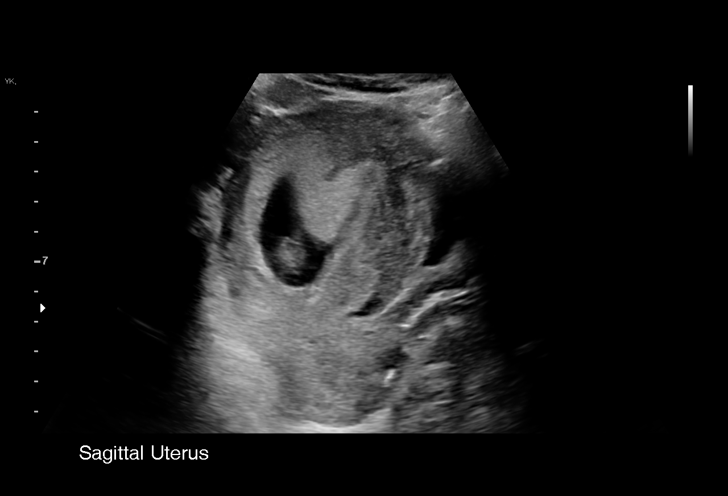
[im 13/34]
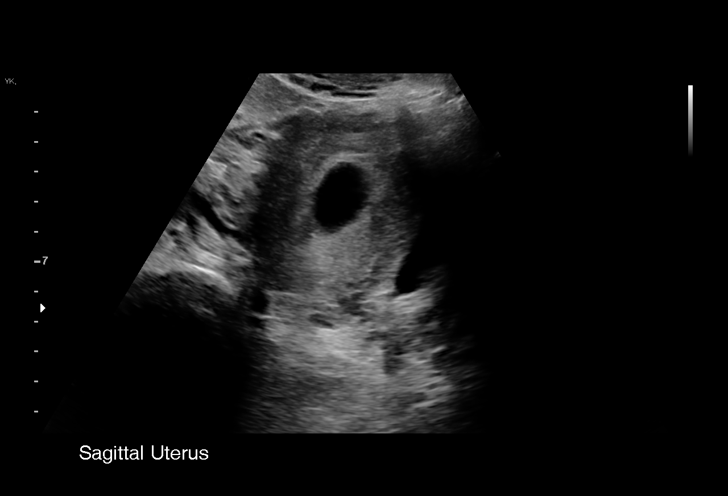
[im 15/34]
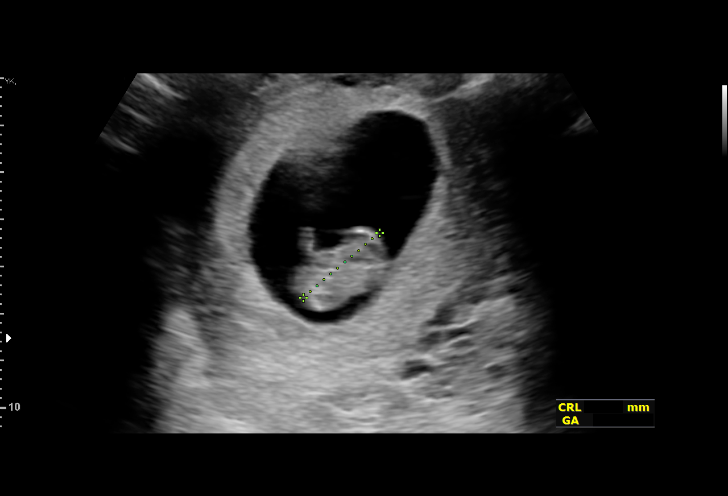
[im 18/34]
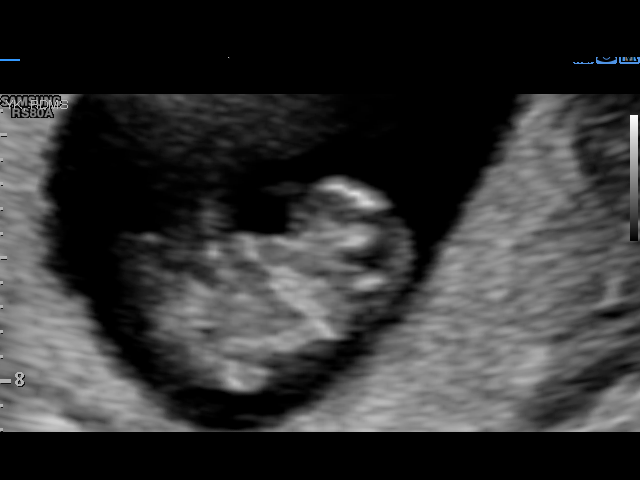
[im 19/34]
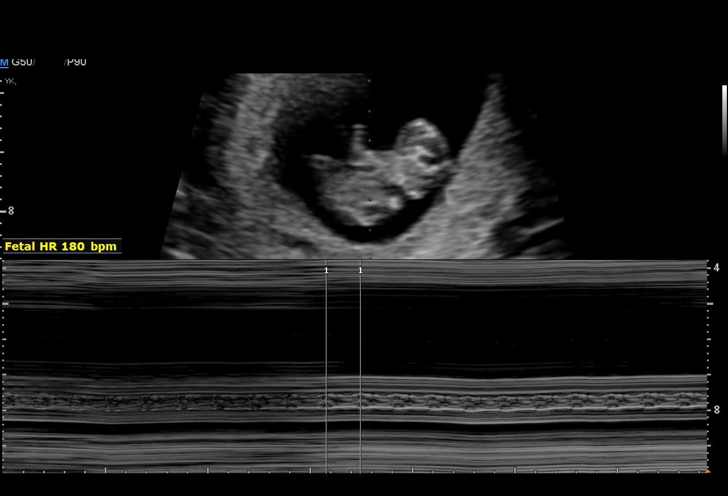
[im 21/34]
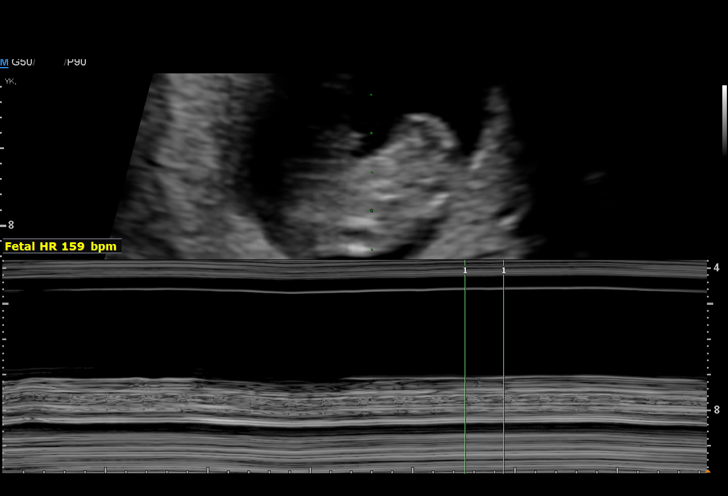
[im 24/34]
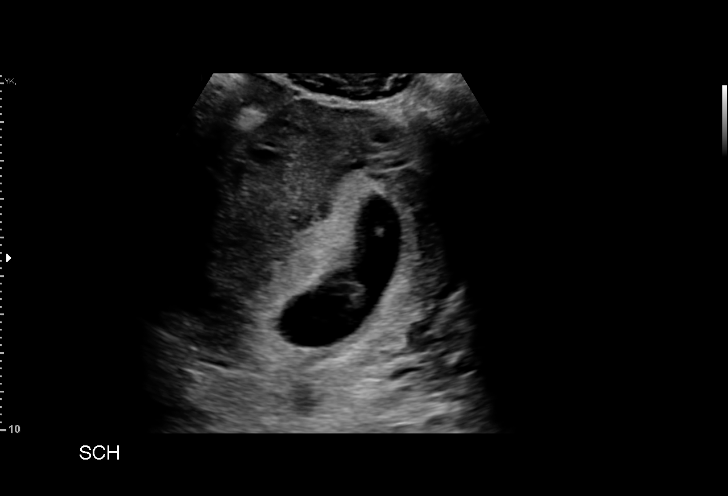
[im 26/34]
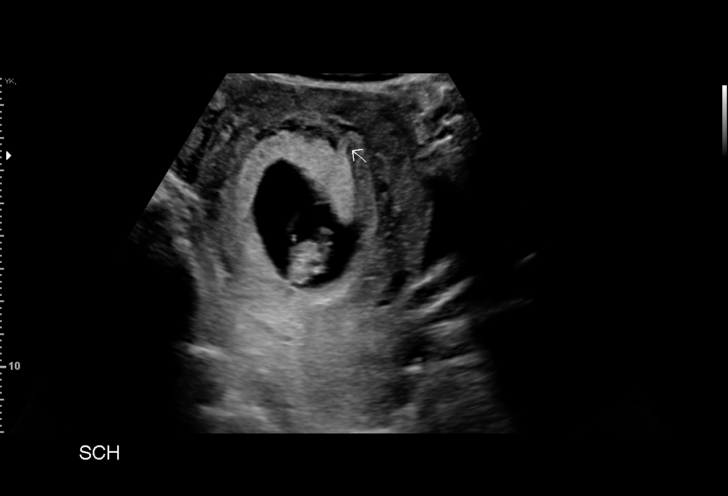
[im 29/34]
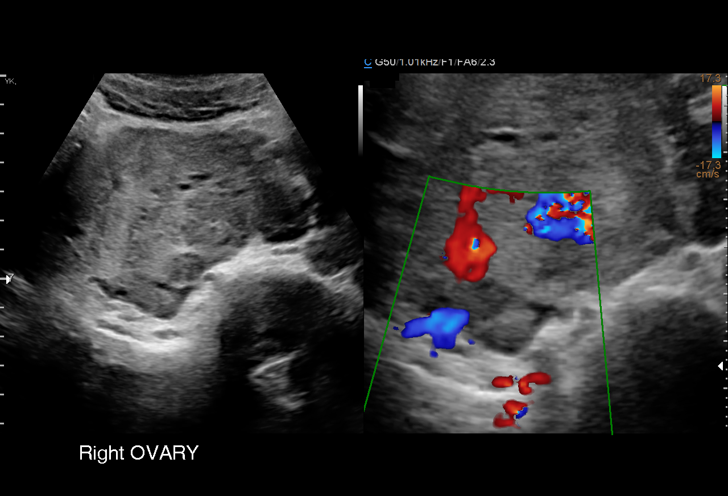
[im 31/34]
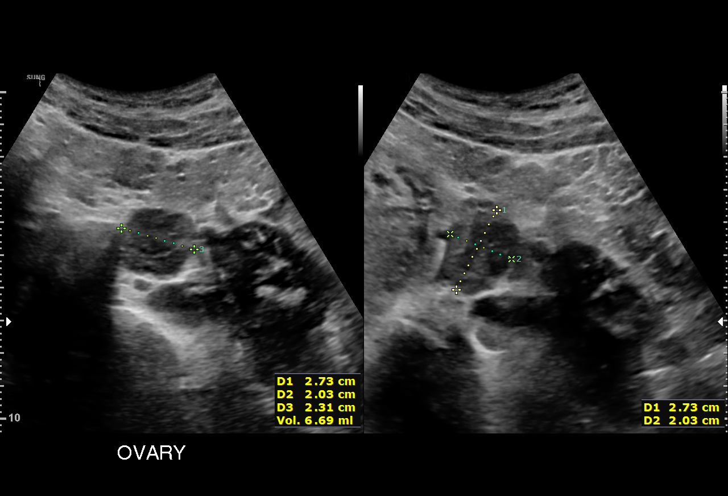
[im 34/34]
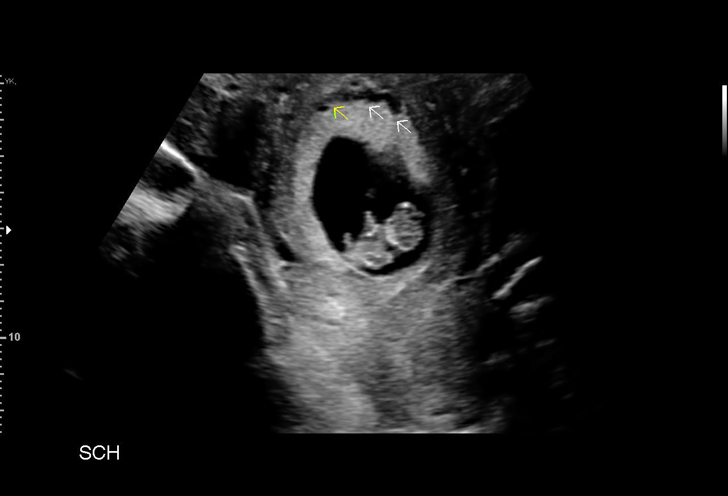

[15 of 28 positions shown; findings below may reference images not displayed]

FINDINGS: Intrauterine gestational sac: Single

Yolk sac:  Visualized

Embryo:  Visualized

Cardiac Activity: Visualized

Heart Rate: 169 bpm

MSD:    mm    w     d

CRL:   21.1 mm   8 w 5 d                  US EDC: 06/21/2020

Subchorionic hemorrhage:  Small subchorionic hemorrhage

Maternal uterus/adnexae: No adnexal mass or free fluid
IMPRESSION: Eight week 5 day intrauterine pregnancy. Fetal heart rate 169 beats
per minute. Small subchorionic hemorrhage.

## 2020-11-23 ENCOUNTER — Other Ambulatory Visit: Payer: Self-pay

## 2020-11-23 ENCOUNTER — Encounter: Payer: Self-pay | Admitting: Obstetrics

## 2020-11-23 ENCOUNTER — Ambulatory Visit (INDEPENDENT_AMBULATORY_CARE_PROVIDER_SITE_OTHER): Payer: Medicaid Other | Admitting: Obstetrics

## 2020-11-23 ENCOUNTER — Other Ambulatory Visit (HOSPITAL_COMMUNITY)
Admission: RE | Admit: 2020-11-23 | Discharge: 2020-11-23 | Disposition: A | Discharge status: Home / Self Care | Payer: Medicaid Other | Source: Ambulatory Visit | Attending: Obstetrics | Admitting: Obstetrics

## 2020-11-23 VITALS — BP 116/74 | HR 86 | Ht 62.0 in | Wt 135.4 lb

## 2020-11-23 DIAGNOSIS — N898 Other specified noninflammatory disorders of vagina: Secondary | ICD-10-CM | POA: Insufficient documentation

## 2020-11-23 DIAGNOSIS — B369 Superficial mycosis, unspecified: Secondary | ICD-10-CM

## 2020-11-23 DIAGNOSIS — N760 Acute vaginitis: Secondary | ICD-10-CM

## 2020-11-23 DIAGNOSIS — Z01419 Encounter for gynecological examination (general) (routine) without abnormal findings: Secondary | ICD-10-CM | POA: Diagnosis not present

## 2020-11-23 DIAGNOSIS — B9689 Other specified bacterial agents as the cause of diseases classified elsewhere: Secondary | ICD-10-CM

## 2020-11-23 DIAGNOSIS — Z113 Encounter for screening for infections with a predominantly sexual mode of transmission: Secondary | ICD-10-CM

## 2020-11-23 DIAGNOSIS — Z3044 Encounter for surveillance of vaginal ring hormonal contraceptive device: Secondary | ICD-10-CM | POA: Diagnosis not present

## 2020-11-23 MED ORDER — CLOTRIMAZOLE 1 % EX CREA: 1.0000 "application " | TOPICAL_CREAM | Freq: Two times a day (BID) | CUTANEOUS | 0 refills | Status: DC

## 2020-11-23 MED ORDER — CLOTRIMAZOLE 1 % EX CREA: 1.0000 "application " | TOPICAL_CREAM | Freq: Two times a day (BID) | CUTANEOUS | 1 refills | Status: DC

## 2020-11-23 MED ORDER — METRONIDAZOLE 500 MG PO TABS: 500.0000 mg | ORAL_TABLET | Freq: Two times a day (BID) | ORAL | 2 refills | Status: DC

## 2020-11-23 NOTE — Progress Notes (Signed)
Subjective:        Frank Pilger is a 30 y.o. female here for a routine exam.  Current complaints: Malodorous vaginal discharge and itching at top of clitoris and in mons above that area.   Personal health questionnaire:  Is patient Ashkenazi Jewish, have a family history of breast and/or ovarian cancer: no Is there a family history of uterine cancer diagnosed at age < 89, gastrointestinal cancer, urinary tract cancer, family member who is a Personnel officer syndrome-associated carrier: no Is the patient overweight and hypertensive, family history of diabetes, personal history of gestational diabetes, preeclampsia or PCOS: no Is patient over 37, have PCOS,  family history of premature CHD under age 41, diabetes, smoke, have hypertension or peripheral artery disease:  no At any time, has a partner hit, kicked or otherwise hurt or frightened you?: no Over the past 2 weeks, have you felt down, depressed or hopeless?: no Over the past 2 weeks, have you felt little interest or pleasure in doing things?:no   Gynecologic History Patient's last menstrual period was 11/02/2020. Contraception: NuvaRing vaginal inserts Last Pap: ~ a year ago. Results were: normal Last mammogram: n/a. Results were: n/a  Obstetric History OB History  Gravida Para Term Preterm AB Living  8 2 1 1 6 2   SAB IAB Ectopic Multiple Live Births    6   0 2    # Outcome Date GA Lbr Len/2nd Weight Sex Delivery Anes PTL Lv  8 Preterm 04/25/20 [redacted]w[redacted]d / 00:08 5 lb 1.1 oz (2.3 kg) F Vag-Spont None  LIV  7 Term 04/17/15    M Vag-Spont EPI    6 IAB           5 IAB           4 IAB           3 IAB           2 IAB           1 IAB             Past Medical History:  Diagnosis Date  . Medical history non-contributory     Past Surgical History:  Procedure Laterality Date  . THERAPEUTIC ABORTION    . WISDOM TOOTH EXTRACTION       Current Outpatient Medications:  .  etonogestrel-ethinyl estradiol (NUVARING) 0.12-0.015 MG/24HR  vaginal ring, Insert vaginally and leave in place for 3 consecutive weeks, then remove for 1 week., Disp: 1 each, Rfl: 12 .  metroNIDAZOLE (FLAGYL) 500 MG tablet, Take 1 tablet (500 mg total) by mouth 2 (two) times daily., Disp: 14 tablet, Rfl: 2 .  acetaminophen (TYLENOL) 325 MG tablet, Take 2 tablets (650 mg total) by mouth every 4 (four) hours as needed (for pain scale < 4). (Patient not taking: No sig reported), Disp: 30 tablet, Rfl: 0 .  clotrimazole (LOTRIMIN) 1 % cream, Apply 1 application topically 2 (two) times daily., Disp: 60 g, Rfl: 1 .  Elastic Bandages & Supports (COMFORT FIT MATERNITY SUPP LG) MISC, 1 Device by Does not apply route daily as needed. (Patient not taking: No sig reported), Disp: 1 each, Rfl: 0 .  ibuprofen (ADVIL) 600 MG tablet, Take 1 tablet (600 mg total) by mouth every 6 (six) hours. (Patient not taking: No sig reported), Disp: 30 tablet, Rfl: 0 .  metroNIDAZOLE (FLAGYL) 500 MG tablet, Take 1 tablet (500 mg total) by mouth 2 (two) times daily. (Patient not taking: Reported on 11/23/2020), Disp: 14  tablet, Rfl: 0 .  Prenatal Vit-Fe Fumarate-FA (PRENATAL MULTIVITAMIN) TABS tablet, Take 1 tablet by mouth daily at 12 noon. (Patient not taking: Reported on 11/23/2020), Disp: , Rfl:  No Known Allergies  Social History   Tobacco Use  . Smoking status: Former Games developer  . Smokeless tobacco: Never Used  . Tobacco comment: none since + UPT  Substance Use Topics  . Alcohol use: Not Currently    Family History  Problem Relation Age of Onset  . Hypertension Mother   . Diabetes Maternal Grandmother       Review of Systems  Constitutional: negative for fatigue and weight loss Respiratory: negative for cough and wheezing Cardiovascular: negative for chest pain, fatigue and palpitations Gastrointestinal: negative for abdominal pain and change in bowel habits Musculoskeletal:negative for myalgias Neurological: negative for gait problems and tremors Behavioral/Psych: negative  for abusive relationship, depression Endocrine: negative for temperature intolerance    Genitourinary:negative for abnormal menstrual periods, genital lesions, hot flashes, sexual problems.  Positive for malodorous vaginal discharge and itching at top of clitoris Integument/breast: negative for breast lump, breast tenderness, nipple discharge and skin lesion(s)    Objective:       BP 116/74   Pulse 86   Ht 5\' 2"  (1.575 m)   Wt 135 lb 6.4 oz (61.4 kg)   LMP 11/02/2020   Breastfeeding No   BMI 24.76 kg/m  General:   alert and no distress  Skin:   no rash or abnormalities  Lungs:   clear to auscultation bilaterally  Heart:   regular rate and rhythm, S1, S2 normal, no murmur, click, rub or gallop  Breasts:   normal without suspicious masses, skin or nipple changes or axillary nodes  Abdomen:  normal findings: no organomegaly, soft, non-tender and no hernia  Pelvis:  External genitalia: normal general appearance Urinary system: urethral meatus normal and bladder without fullness, nontender Vaginal: normal without tenderness, induration or masses Cervix: normal appearance Adnexa: normal bimanual exam Uterus: anteverted and non-tender, normal size   Lab Review Urine pregnancy test Labs reviewed yes Radiologic studies reviewed no  50% of 20 min visit spent on counseling and coordination of care.   Assessment:     1. Encounter for routine gynecological examination with Papanicolaou smear of cervix Rx: - Cytology - PAP( [place])  2. Vaginal discharge, malodorous Rx: - Cervicovaginal ancillary only( [place])  3. BV (bacterial vaginosis) Rx: - metroNIDAZOLE (FLAGYL) 500 MG tablet; Take 1 tablet (500 mg total) by mouth 2 (two) times daily.  Dispense: 14 tablet; Refill: 2  4. Screening for STD (sexually transmitted disease) Rx: - HIV Antibody (routine testing w rflx) - Hepatitis B surface antigen - RPR - Hepatitis C antibody  5. Superficial fungus infection of  skin Rx; - clotrimazole (LOTRIMIN) 1 % cream; Apply 1 application topically 2 (two) times daily.  Dispense: 30 g; Refill: 0  6. Encounter for surveillance of vaginal ring hormonal contraceptive device - pleased with NuvaRing   Plan:    Education reviewed: calcium supplements, depression evaluation, low fat, low cholesterol diet, safe sex/STD prevention, self breast exams and weight bearing exercise. Contraception: NuvaRing vaginal inserts. Follow up in: 1 year.   Meds ordered this encounter  Medications  . metroNIDAZOLE (FLAGYL) 500 MG tablet    Sig: Take 1 tablet (500 mg total) by mouth 2 (two) times daily.    Dispense:  14 tablet    Refill:  2  . DISCONTD: clotrimazole (LOTRIMIN) 1 % cream  Sig: Apply 1 application topically 2 (two) times daily.    Dispense:  30 g    Refill:  0  . clotrimazole (LOTRIMIN) 1 % cream    Sig: Apply 1 application topically 2 (two) times daily.    Dispense:  60 g    Refill:  1   Orders Placed This Encounter  Procedures  . HIV Antibody (routine testing w rflx)  . Hepatitis B surface antigen  . RPR  . Hepatitis C antibody    Brock Bad, MD 11/23/2020 4:44 PM

## 2020-11-23 NOTE — Progress Notes (Signed)
Pt is in the office for annual Last pap 1 year ago in [place] Reports vaginal odor and irritation.

## 2020-11-24 LAB — HEPATITIS B SURFACE ANTIGEN: Hepatitis B Surface Ag: NEGATIVE

## 2020-11-24 LAB — SYPHILIS: RPR W/REFLEX TO RPR TITER AND TREPONEMAL ANTIBODIES, TRADITIONAL SCREENING AND DIAGNOSIS ALGORITHM: RPR Ser Ql: NONREACTIVE

## 2020-11-24 LAB — HEPATITIS C ANTIBODY: Hep C Virus Ab: <0.1 {s_co_ratio} (ref 0.0–0.9)

## 2020-11-24 LAB — HIV ANTIBODY (ROUTINE TESTING W REFLEX): HIV Screen 4th Generation wRfx: NONREACTIVE

## 2020-11-25 ENCOUNTER — Other Ambulatory Visit: Payer: Self-pay | Admitting: Obstetrics

## 2020-11-25 LAB — CERVICOVAGINAL ANCILLARY ONLY
Bacterial Vaginitis (gardnerella): POSITIVE — AB
Candida Glabrata: NEGATIVE
Candida Vaginitis: NEGATIVE
Chlamydia: NEGATIVE
Comment: NEGATIVE
Comment: NEGATIVE
Comment: NEGATIVE
Comment: NEGATIVE
Comment: NEGATIVE
Comment: NORMAL
Neisseria Gonorrhea: NEGATIVE
Trichomonas: NEGATIVE

## 2020-12-01 LAB — CYTOLOGY - PAP
Comment: NEGATIVE
Comment: NEGATIVE
Diagnosis: NEGATIVE
Diagnosis: REACTIVE
HPV 16: NEGATIVE
HPV 18 / 45: POSITIVE — AB
High risk HPV: POSITIVE — AB

## 2020-12-15 ENCOUNTER — Ambulatory Visit (INDEPENDENT_AMBULATORY_CARE_PROVIDER_SITE_OTHER): Payer: Medicaid Other | Admitting: Obstetrics

## 2020-12-15 ENCOUNTER — Encounter: Payer: Self-pay | Admitting: Obstetrics

## 2020-12-15 ENCOUNTER — Other Ambulatory Visit: Payer: Self-pay

## 2020-12-15 VITALS — BP 119/68 | HR 93 | Wt 134.0 lb

## 2020-12-15 DIAGNOSIS — R87810 Cervical high risk human papillomavirus (HPV) DNA test positive: Secondary | ICD-10-CM | POA: Diagnosis not present

## 2020-12-15 LAB — POCT URINE PREGNANCY: Preg Test, Ur: NEGATIVE

## 2020-12-15 NOTE — Progress Notes (Signed)
Pt presents for abnormal pap/colpo procedure.  LMP: 11/28/20.   Last had unprotected intercourse 12/05/20.  Contraception :Nuvaring

## 2020-12-15 NOTE — Progress Notes (Signed)
Colposcopy Procedure Note  Indications: Pap smear 1 months ago showed: NILM with positive HRHPV. The prior pap showed no abnormalities.  Prior cervical/vaginal disease: normal exam without visible pathology. Prior cervical treatment: no treatment.  Procedure Details  The risks and benefits of the procedure and Written informed consent obtained.  A time-out was performed confirming the patient, procedure and allergy status  Speculum placed in vagina and excellent visualization of cervix achieved, cervix swabbed x 3 with acetic acid solution.  Findings: Cervix: no visible lesions, no mosaicism, no punctation and no abnormal vasculature; no biopsies taken.   Vaginal inspection: normal without visible lesions. Vulvar colposcopy: vulvar colposcopy not performed.   Physical Exam   Specimens: None  Complications: none.  Plan: Repeat co-testing in 1 year per [facility]P Guidelines  Brock Bad, MD 12/15/2020 4:07 PM

## 2021-01-19 ENCOUNTER — Encounter: Payer: Self-pay | Admitting: Obstetrics

## 2021-01-19 ENCOUNTER — Other Ambulatory Visit: Payer: Self-pay

## 2021-01-19 ENCOUNTER — Other Ambulatory Visit (HOSPITAL_COMMUNITY)
Admission: RE | Admit: 2021-01-19 | Discharge: 2021-01-19 | Disposition: A | Discharge status: Home / Self Care | Payer: Medicaid Other | Source: Ambulatory Visit | Attending: Obstetrics | Admitting: Obstetrics

## 2021-01-19 ENCOUNTER — Ambulatory Visit (INDEPENDENT_AMBULATORY_CARE_PROVIDER_SITE_OTHER): Payer: Medicaid Other | Admitting: Obstetrics

## 2021-01-19 VITALS — BP 116/68 | HR 82 | Ht 62.0 in | Wt 133.0 lb

## 2021-01-19 DIAGNOSIS — B373 Candidiasis of vulva and vagina: Secondary | ICD-10-CM | POA: Diagnosis not present

## 2021-01-19 DIAGNOSIS — N898 Other specified noninflammatory disorders of vagina: Secondary | ICD-10-CM | POA: Insufficient documentation

## 2021-01-19 DIAGNOSIS — B9689 Other specified bacterial agents as the cause of diseases classified elsewhere: Secondary | ICD-10-CM

## 2021-01-19 DIAGNOSIS — B369 Superficial mycosis, unspecified: Secondary | ICD-10-CM | POA: Diagnosis not present

## 2021-01-19 DIAGNOSIS — B3731 Acute candidiasis of vulva and vagina: Secondary | ICD-10-CM

## 2021-01-19 DIAGNOSIS — N760 Acute vaginitis: Secondary | ICD-10-CM | POA: Diagnosis not present

## 2021-01-19 MED ORDER — METRONIDAZOLE 500 MG PO TABS: 500.0000 mg | ORAL_TABLET | Freq: Two times a day (BID) | ORAL | 2 refills | Status: DC

## 2021-01-19 MED ORDER — FLUCONAZOLE 200 MG PO TABS: 200.0000 mg | ORAL_TABLET | ORAL | 2 refills | Status: DC

## 2021-01-19 MED ORDER — CLOTRIMAZOLE 1 % EX CREA: 1.0000 "application " | TOPICAL_CREAM | Freq: Two times a day (BID) | CUTANEOUS | 2 refills | Status: DC

## 2021-01-19 NOTE — Patient Instructions (Signed)
Human Papillomavirus Human papillomavirus (HPV) is a common virus that spreads easily from person to person through skin-to-skin or sexual contact. There are many types of HPV. It often does not cause symptoms. However, depending upon the type, it may sometimes cause warts in the genitals (genital or mucosal HPV), or on the hands or feet (cutaneous or nonmucosal HPV). It is possible to be infected for a long time and pass HPV to others without knowing it. In most cases of HPV, a person will recover from the virus without treatment within 2 years of being infected. However, in some cases, HPV infection can last longer and lead to serious health problems. Certain types of genital or mucosal HPV are considered high-risk and may cause cancers, including cancer of the lower part of the uterus (cervix), vagina, outer female genital area (vulva), penis, anus, and rectum as well as cancers of the oral cavity, such as the throat, tongue, and tonsils. What are the causes? HPV is caused by a virus that spreads from person to person through contact. This includes genital HPV, which spreads through oral, vaginal, or anal sex. What increases the risk? You may be more likely to develop this condition if you have or have had:  Direct contact with a person with HPV.  Unprotected oral, vaginal, or anal sex.  Several sex partners.  A sex partner who has other sex partners.  Another sexually transmitted infection (STI).  A weak disease-fighting system (immune system).  Areas of damaged or non-intact skin. What are the signs or symptoms? Most people who have HPV do not have any symptoms. If symptoms are present, they may include:  Wart-like lesions in the throat (from having oral sex).  Warts on the infected skin.  Genital warts that may itch, burn, bleed, or be painful during sex.   How is this diagnosed? If you have wart-like lumps in the anal area or throat, warts along the soles of your feet or palms of  your hand, or if genital warts are present, your health care provider can usually diagnose HPV with a physical exam. Genital warts are easily seen. In females, tests may be used to diagnose genital HPV, including:  A Pap test. A Pap test takes a sample of cells from the cervix to check for cancer and HPV infection.  An HPV test. This is similar to a Pap test and involves taking a sample of cells from the cervix. It may be done at the same time as a Pap test.  Using a scope to view the cervix (colposcopy). This may be done if a pelvic exam or Pap test is abnormal. A sample of tissue may be removed for testing (biopsy) during the colposcopy. Currently, there is no test to detect genital HPV in males. How is this treated? There is no treatment for the virus itself. However, there are treatments for the health problems and symptoms HPV can cause. Treatment for HPV may include:  Medicines in a cream, lotion, liquid, or gel form. These medicines may be injected into the warts, or applied directly to them.  Use of a probe to apply extreme cold (cryotherapy) to the warts.  Application of an intense beam of light (laser treatment) on the warts.  Use of a probe to apply extreme heat (electrocautery) to the warts.  Surgery to remove the warts. Your health care provider will monitor you closely after you are treated. HPV can come back and you may need treatment again. Follow these instructions at  home: Medicines  Take over-the-counter and prescription medicines only as told by your health care provider. This includes creams for itching or irritation.  Do not treat genital or anal warts with medicines used for treating hand warts. General instructions  Do not touch or scratch the warts.  Do not have sex while you are being treated.  Do not douche or use tampons during treatment (for women).  Tell your sex partner about your infection. He or she may also need to be treated.  If you become  pregnant, tell your health care provider that you have HPV. Your health care provider will monitor you closely during pregnancy to make sure you and your baby are safe.  Keep all follow-up visits. This is important. How is this prevented?  Talk with your health care provider about getting an HPV vaccine, which can prevent some HPV infections and related cancers. It will not work if you already have HPV and is not recommended for pregnant women. You may need 2-3 doses of the vaccine, depending on your age.  After treatment, use condoms during sex to prevent future infections.  Have only one sex partner.  Have a sex partner who does not have other sex partners.  Get regular Pap tests as directed by your health care provider. Contact a health care provider if:  The treated skin becomes red, swollen, or painful.  You have a fever.  You feel generally ill.  You feel lumps or pimples in and around your genital or anal area.  You develop bleeding of the vagina or the treatment area.  You have painful sex. Summary  Human papillomavirus (HPV) is a common virus that spreads easily from person to person and ishighly contagious.  Many people carrying HPV do not have any symptoms.  Many forms of HPV can be prevented with vaccination.  There is no treatment for the virus itself. However, there are treatments for the health problems and symptoms HPV can cause. This information is not intended to replace advice given to you by your health care provider. Make sure you discuss any questions you have with your health care provider. Document Revised: 05/10/2020 Document Reviewed: 05/10/2020 Elsevier Patient Education  2021 [employer]  [email or web address]  Colposcopy  Colposcopy is a procedure to examine the lowest part of the uterus (cervix), the vagina, and the area around the vaginal opening (vulva) for  abnormalities or signs of disease. This procedure is done using an instrument that makes objects appear larger and provides light. (colposcope). During the procedure, the health care provider may remove a tissue sample to be looked at later under a microscope (biopsy). A biopsy may be done if any unusual cells are found during the colposcopy. You may have a colposcopy if you have:  An abnormal Pap smear, also called a Pap test. This screening test is used to check for signs of cancer or infection of the vagina, cervix, and uterus.  An HPV (human papillomavirus) test and get a positive result for a type of HPV that puts you at high risk of cancer.  Certain conditions or symptoms, such as: ? A sore, or lesion, on your cervix. ? Genital warts on your vulva, vagina, or cervix. ? Pain during sex. ? Vaginal bleeding, especially after sex.  A growth on your cervix (cervical polyp) that needs to be removed. Let your health care provider know about:  Any allergies you have, including allergies to medicines, latex, or iodine.  All medicines you are taking, including  vitamins, herbs, eye drops, creams, and over-the-counter medicines.  Any problems you or family members have had with anesthetic medicines.  Any blood disorders you have.  Any surgeries you have had.  Any medical conditions you have, such as pelvic inflammatory disease (PID) or endometrial disorder.  The pattern of your menstrual cycles and the form of birth control (contraception) you use, if any.  Your medical history, including any history of fainting often or of cervical treatment.  Whether you are pregnant or may be pregnant. What are the risks? Generally, this is a safe procedure. However, problems may occur, including:  Infection. Symptoms of infection may include fever, bad-smelling vaginal discharge, or pelvic pain.  Allergic reactions to medicines.  Damage to nearby structures or organs.  Fainting. This is  rare. What happens before the procedure? Eating and drinking restrictions  Follow instructions from your health care provider about eating or drinking restrictions.  You will likely need to eat a regular diet the day of the procedure and not skip any meals. Tests  You may have an exam or testing. A pregnancy test will be done the day of the procedure.  You may have a blood or urine sample taken. General instructions  Ask your health care provider about: ? Changing or stopping your regular medicines. This is especially important if you are taking diabetes medicines or blood thinners. ? Taking medicines such as aspirin and ibuprofen. These medicines can thin your blood. Do not take these medicines unless your health care provider tells you to take them. Your health care provider will likely tell you to avoid taking aspirin, or medicine that contains aspirin, for 7 days before the procedure. ? Taking over-the-counter medicines, vitamins, herbs, and supplements.  Tell your health care provider if you have your menstrual period now or will have it at the time of your procedure. A colposcopy is not normally done during your menstrual period.  If you use contraception, continue to use it before your procedure.  For 24 hours before the procedure: ? Do not use douche products or tampons. ? Do not use medicines, creams, or suppositories in the vagina. ? Do not have sex.  Ask your health care provider what steps will be taken to prevent infection. What happens during the procedure?  You will lie down on your back, with your feet in foot rests (stirrups).  A tool called a speculum will be warmed and will have oil or gel put on it (will be lubricated). The speculum will then be inserted into your vagina. This will be used to hold apart the walls of your vagina so your health care provider can see your cervix and the inside of your vagina.  A cotton swab will be used to place a small amount of a  liquid (solution) on the areas to be examined. This solution makes it easier to see abnormal cells. You may feel a slight burning during this part.  The colposcope will be used to scan the cervix with a bright white light. The colposcope will be held near your vulva and will make your vulva, vagina, and cervix look bigger so they can be seen better.  If a biopsy is needed: ? You may be given a medicine to numb the area (local anesthetic). ? Surgical tools will be used to remove mucus and cells through your vagina. ? You may feel mild pain while the tissue sample is removed. ? Bleeding may occur. A solution may be used to stop the  bleeding. ? If a biopsy is needed from the inside of the cervix, a different procedure called endocervical curettage (ECC) may be done. During this procedure, a curved tool called a curette will be used to scrape cells from your cervix or the top of your cervix (endocervix).  Any abnormalities that are found will be recorded. The procedure may vary among health care providers and hospitals. What happens after the procedure?  You will lie down and rest for a few minutes. You may be offered juice or cookies.  Your blood pressure, heart rate, breathing rate, and blood oxygen level will be monitored until you leave the hospital or clinic.  You may have some cramping in your abdomen. This should go away after a few minutes.  It is up to you to get the results of your procedure. Ask your health care provider, or the department that is doing the procedure, when your results will be ready. Summary  Colposcopy is a procedure to examine the lowest part of the uterus (cervix), the vagina, and the area around the vaginal opening (vulva) for abnormalities or signs of disease.  A biopsy may be done as part of the procedure.  After the procedure, you will remain lying down and will rest for a few minutes.  You may have some cramping in your abdomen. This should go away after  a few minutes. This information is not intended to replace advice given to you by your health care provider. Make sure you discuss any questions you have with your health care provider. Document Revised: 09/23/2019 Document Reviewed: 09/23/2019 Elsevier Patient Education  2021 [employer] Inc.  HPV Test Why am I having this test? HPV (human papillomavirus) refers to a group of over 150 viruses. Many of these viruses cause growths on the surfaces of the skin, including the genitals or along the throat. Most HPV viruses cause infections that usually go away without treatment. The HPV test checks for high-risk types (strains) of HPV. Strains 16 and 18 are considered the most high-risk for cancer. If you are infected with a high-risk strain of HPV, it can increase your risk for cancer of the cervix, vagina, vulva, anus, penis, or throat. HPV can be found in both males and females. However, HPV testing is only approved for use in females:  Who are 2930-31 years old.  Who have an abnormal Pap test.  Who have been treated for an abnormal Pap test in the past.  Who have been treated for a high-risk HPV infection in the past. If you are a woman older than 30, you may have the HPV test at the same time as a pelvic exam and Pap test (cotesting). There is a high chance of finding HPV if HPV test, pelvic exam, and Pap test are done at the same time. What is being tested? This test checks for the DNA (genetic) strands of the HPV infection. This test is also called the HPV DNA test. What kind of sample is taken? This test requires a sample of cells from the cervix. This will be done using a small cotton swab, plastic spatula, or brush. This sample is often collected during a pelvic exam, when you are lying on your back on an exam table with feet in footrests (stirrups).   How do I prepare for this test?  Starting 24-48 hours before your test, or as told by your health care provider, do not: ? Take a  bath. ? Have sex. ? Douche.  Schedule the  test for a day when you are not menstruating. If you are menstruating on the day of the test, you may need to reschedule.  You will be asked to urinate right before the test. How are the results reported? Your test results will be reported as either positive or negative for HPV. What do the results mean? A negative HPV test result means that no HPV was found. This means it is very likely that you do not have HPV. A positive HPV test result means that you have high-risk HPV that can lead to certain types of cancer. It does not mean that you have cancer. Talk with your health care provider about what your results mean. Questions to ask your health care provider Ask your health care provider, or the department that is doing the test:  When will my results be ready?  How will I get my results?  What are my treatment options?  What other tests do I need?  What are my next steps? Summary  The human papillomavirus (HPV) test is used to look for high-risk types of HPV infection. This test is done only for females.  HPV types 16 and 18 are considered high-risk types of HPV. If untreated, these types of infections increase your risk for cancer of the cervix, vagina, vulva, anus, penis, or throat.  A negative HPV test result means that no HPV was found, and it is very likely that you do not have HPV.  A positive HPV test result means that you have an HPV infection. It does not mean that you have cancer. This information is not intended to replace advice given to you by your health care provider. Make sure you discuss any questions you have with your health care provider. Document Revised: 05/10/2020 Document Reviewed: 05/10/2020 Elsevier Patient Education  2021 [employer] Inc.  Human Papillomavirus Human papillomavirus (HPV) is a common virus that spreads easily from person to person through skin-to-skin or sexual contact. There are many types of  HPV. It often does not cause symptoms. However, depending upon the type, it may sometimes cause warts in the genitals (genital or mucosal HPV), or on the hands or feet (cutaneous or nonmucosal HPV). It is possible to be infected for a long time and pass HPV to others without knowing it. In most cases of HPV, a person will recover from the virus without treatment within 2 years of being infected. However, in some cases, HPV infection can last longer and lead to serious health problems. Certain types of genital or mucosal HPV are considered high-risk and may cause cancers, including cancer of the lower part of the uterus (cervix), vagina, outer female genital area (vulva), penis, anus, and rectum as well as cancers of the oral cavity, such as the throat, tongue, and tonsils. What are the causes? HPV is caused by a virus that spreads from person to person through contact. This includes genital HPV, which spreads through oral, vaginal, or anal sex. What increases the risk? You may be more likely to develop this condition if you have or have had:  Direct contact with a person with HPV.  Unprotected oral, vaginal, or anal sex.  Several sex partners.  A sex partner who has other sex partners.  Another sexually transmitted infection (STI).  A weak disease-fighting system (immune system).  Areas of damaged or non-intact skin. What are the signs or symptoms? Most people who have HPV do not have any symptoms. If symptoms are present, they may include:  Wart-like  lesions in the throat (from having oral sex).  Warts on the infected skin.  Genital warts that may itch, burn, bleed, or be painful during sex.   How is this diagnosed? If you have wart-like lumps in the anal area or throat, warts along the soles of your feet or palms of your hand, or if genital warts are present, your health care provider can usually diagnose HPV with a physical exam. Genital warts are easily seen. In females, tests may  be used to diagnose genital HPV, including:  A Pap test. A Pap test takes a sample of cells from the cervix to check for cancer and HPV infection.  An HPV test. This is similar to a Pap test and involves taking a sample of cells from the cervix. It may be done at the same time as a Pap test.  Using a scope to view the cervix (colposcopy). This may be done if a pelvic exam or Pap test is abnormal. A sample of tissue may be removed for testing (biopsy) during the colposcopy. Currently, there is no test to detect genital HPV in males. How is this treated? There is no treatment for the virus itself. However, there are treatments for the health problems and symptoms HPV can cause. Treatment for HPV may include:  Medicines in a cream, lotion, liquid, or gel form. These medicines may be injected into the warts, or applied directly to them.  Use of a probe to apply extreme cold (cryotherapy) to the warts.  Application of an intense beam of light (laser treatment) on the warts.  Use of a probe to apply extreme heat (electrocautery) to the warts.  Surgery to remove the warts. Your health care provider will monitor you closely after you are treated. HPV can come back and you may need treatment again. Follow these instructions at home: Medicines  Take over-the-counter and prescription medicines only as told by your health care provider. This includes creams for itching or irritation.  Do not treat genital or anal warts with medicines used for treating hand warts. General instructions  Do not touch or scratch the warts.  Do not have sex while you are being treated.  Do not douche or use tampons during treatment (for women).  Tell your sex partner about your infection. He or she may also need to be treated.  If you become pregnant, tell your health care provider that you have HPV. Your health care provider will monitor you closely during pregnancy to make sure you and your baby are  safe.  Keep all follow-up visits. This is important. How is this prevented?  Talk with your health care provider about getting an HPV vaccine, which can prevent some HPV infections and related cancers. It will not work if you already have HPV and is not recommended for pregnant women. You may need 2-3 doses of the vaccine, depending on your age.  After treatment, use condoms during sex to prevent future infections.  Have only one sex partner.  Have a sex partner who does not have other sex partners.  Get regular Pap tests as directed by your health care provider. Contact a health care provider if:  The treated skin becomes red, swollen, or painful.  You have a fever.  You feel generally ill.  You feel lumps or pimples in and around your genital or anal area.  You develop bleeding of the vagina or the treatment area.  You have painful sex. Summary  Human papillomavirus (HPV) is a  common virus that spreads easily from person to person and ishighly contagious.  Many people carrying HPV do not have any symptoms.  Many forms of HPV can be prevented with vaccination.  There is no treatment for the virus itself. However, there are treatments for the health problems and symptoms HPV can cause. This information is not intended to replace advice given to you by your health care provider. Make sure you discuss any questions you have with your health care provider. Document Revised: 05/10/2020 Document Reviewed: 05/10/2020 Elsevier Patient Education  2021 [employer].

## 2021-01-19 NOTE — Progress Notes (Signed)
GYN presents for Vaginal itching, bump on labia x 2 months.   Last PAP 11/23/2020

## 2021-01-19 NOTE — Progress Notes (Signed)
Patient ID: Sheri Hanna, female   DOB: 04-Jun-1991, 30 y.o.   MRN: [id number]  Chief Complaint  Patient presents with  . Vaginal Itching    HPI Sheri Hanna is a 30 y.o. female.  Complaint of vaginal itching and discharge. HPI  Past Medical History:  Diagnosis Date  . Medical history non-contributory     Past Surgical History:  Procedure Laterality Date  . THERAPEUTIC ABORTION    . WISDOM TOOTH EXTRACTION      Family History  Problem Relation Age of Onset  . Hypertension Mother   . Diabetes Maternal Grandmother     Social History Social History   Tobacco Use  . Smoking status: Former Games developer  . Smokeless tobacco: Never Used  . Tobacco comment: none since + UPT  Substance Use Topics  . Alcohol use: Not Currently  . Drug use: Never    No Known Allergies  Current Outpatient Medications  Medication Sig Dispense Refill  . clotrimazole (LOTRIMIN) 1 % cream Apply 1 application topically 2 (two) times daily. 60 g 2  . etonogestrel-ethinyl estradiol (NUVARING) 0.12-0.015 MG/24HR vaginal ring Insert vaginally and leave in place for 3 consecutive weeks, then remove for 1 week. 1 each 12  . fluconazole (DIFLUCAN) 200 MG tablet Take 1 tablet (200 mg total) by mouth every 3 (three) days. 3 tablet 2  . metroNIDAZOLE (FLAGYL) 500 MG tablet Take 1 tablet (500 mg total) by mouth 2 (two) times daily. 14 tablet 2  . acetaminophen (TYLENOL) 325 MG tablet Take 2 tablets (650 mg total) by mouth every 4 (four) hours as needed (for pain scale < 4). (Patient not taking: No sig reported) 30 tablet 0  . clotrimazole (LOTRIMIN) 1 % cream Apply 1 application topically 2 (two) times daily. (Patient not taking: Reported on 12/15/2020) 60 g 1  . Elastic Bandages & Supports (COMFORT FIT MATERNITY SUPP LG) MISC 1 Device by Does not apply route daily as needed. (Patient not taking: No sig reported) 1 each 0  . ibuprofen (ADVIL) 600 MG tablet Take 1 tablet (600 mg total) by mouth every 6 (six) hours.  (Patient not taking: No sig reported) 30 tablet 0  . metroNIDAZOLE (FLAGYL) 500 MG tablet Take 1 tablet (500 mg total) by mouth 2 (two) times daily. (Patient not taking: No sig reported) 14 tablet 0  . metroNIDAZOLE (FLAGYL) 500 MG tablet Take 1 tablet (500 mg total) by mouth 2 (two) times daily. (Patient not taking: Reported on 12/15/2020) 14 tablet 2  . Prenatal Vit-Fe Fumarate-FA (PRENATAL MULTIVITAMIN) TABS tablet Take 1 tablet by mouth daily at 12 noon. (Patient not taking: No sig reported)     No current facility-administered medications for this visit.    Review of Systems Review of Systems Constitutional: negative for fatigue and weight loss Respiratory: negative for cough and wheezing Cardiovascular: negative for chest pain, fatigue and palpitations Gastrointestinal: negative for abdominal pain and change in bowel habits Genitourinary: positive for vaginal itching and discharge Integument/breast: negative for nipple discharge Musculoskeletal:negative for myalgias Neurological: negative for gait problems and tremors Behavioral/Psych: negative for abusive relationship, depression Endocrine: negative for temperature intolerance      Blood pressure 116/68, pulse 82, height 5\' 2"  (1.575 m), weight 133 lb (60.3 kg), last menstrual period 12/29/2020, not currently breastfeeding.  Physical Exam Physical Exam General:   alert and no distress  Skin:   no rash or abnormalities  Lungs:   clear to auscultation bilaterally  Heart:   regular rate and rhythm, S1,  S2 normal, no murmur, click, rub or gallop  Breasts:   normal without suspicious masses, skin or nipple changes or axillary nodes  Abdomen:  normal findings: no organomegaly, soft, non-tender and no hernia  Pelvis:  External genitalia: normal general appearance Urinary system: urethral meatus normal and bladder without fullness, nontender Vaginal: normal without tenderness, induration or masses Cervix: normal appearance Adnexa:  normal bimanual exam Uterus: anteverted and non-tender, normal size    I have spent a total of 20 minutes of face-to-face time, excluding clinical staff time, reviewing notes and preparing to see patient, ordering tests and/or medications, and counseling the patient.   Data Reviewed Wet Prep and Cultures  Assessment     1. Vaginal discharge Rx: - Cervicovaginal ancillary only( [place])  2. Superficial fungus infection of skin Rx: - clotrimazole (LOTRIMIN) 1 % cream; Apply 1 application topically 2 (two) times daily.  Dispense: 60 g; Refill: 2  3. Candidal vaginitis Rx: - fluconazole (DIFLUCAN) 200 MG tablet; Take 1 tablet (200 mg total) by mouth every 3 (three) days.  Dispense: 3 tablet; Refill: 2  4. BV (bacterial vaginosis) Rx: - metroNIDAZOLE (FLAGYL) 500 MG tablet; Take 1 tablet (500 mg total) by mouth 2 (two) times daily.  Dispense: 14 tablet; Refill: 2    Plan   Follow up for Annual   Meds ordered this encounter  Medications  . fluconazole (DIFLUCAN) 200 MG tablet    Sig: Take 1 tablet (200 mg total) by mouth every 3 (three) days.    Dispense:  3 tablet    Refill:  2  . metroNIDAZOLE (FLAGYL) 500 MG tablet    Sig: Take 1 tablet (500 mg total) by mouth 2 (two) times daily.    Dispense:  14 tablet    Refill:  2  . clotrimazole (LOTRIMIN) 1 % cream    Sig: Apply 1 application topically 2 (two) times daily.    Dispense:  60 g    Refill:  2    Brock Bad, MD 01/20/2021 5:41 PM

## 2021-01-23 ENCOUNTER — Other Ambulatory Visit: Payer: Self-pay | Admitting: Obstetrics

## 2021-01-23 ENCOUNTER — Telehealth: Payer: Self-pay

## 2021-01-23 LAB — CERVICOVAGINAL ANCILLARY ONLY
Bacterial Vaginitis (gardnerella): POSITIVE — AB
Candida Glabrata: NEGATIVE
Candida Vaginitis: NEGATIVE
Chlamydia: NEGATIVE
Comment: NEGATIVE
Comment: NEGATIVE
Comment: NEGATIVE
Comment: NEGATIVE
Comment: NEGATIVE
Comment: NORMAL
Neisseria Gonorrhea: NEGATIVE
Trichomonas: NEGATIVE

## 2021-01-23 NOTE — Telephone Encounter (Signed)
-----   Message from Brock Bad, MD sent at 01/23/2021  1:57 PM EDT ----- Flagyl Rx for BV

## 2021-01-23 NOTE — Telephone Encounter (Signed)
Pt called and given results. Pt has no questions at this time.

## 2021-03-31 IMAGING — US US MFM OB LIMITED
1 series · 15 of 27 positions shown · non-contrast
Comparison: none

[Series 1: us mfm ob limited · 27 acquisitions, 15 frames shown]
[im 1/27]
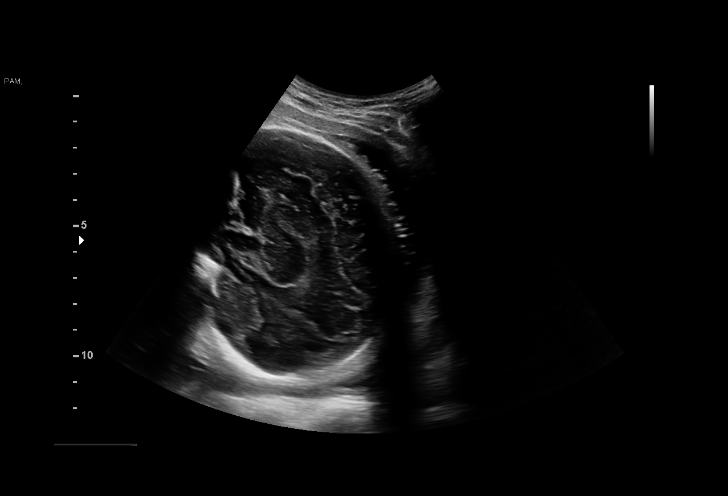
[im 3/27]
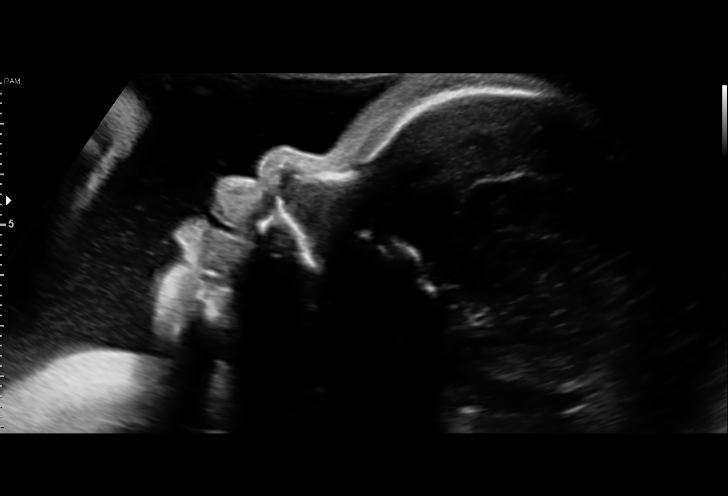
[im 5/27]
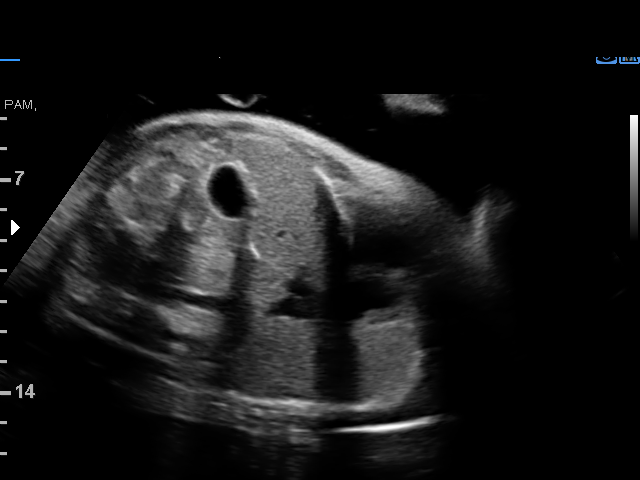
[im 7/27]
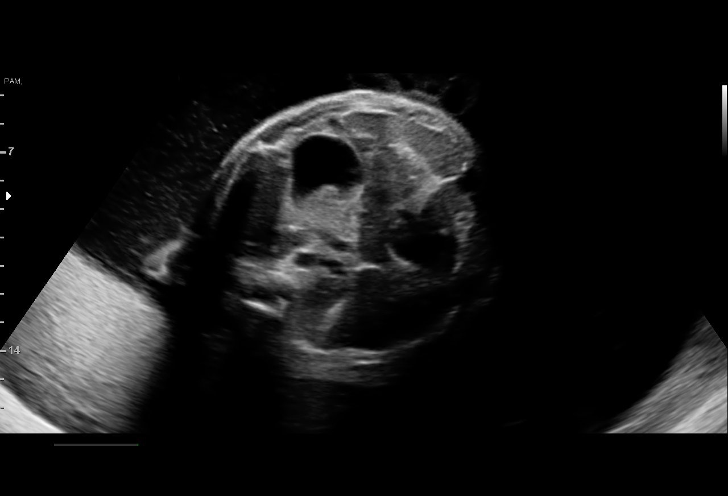
[im 9/27]
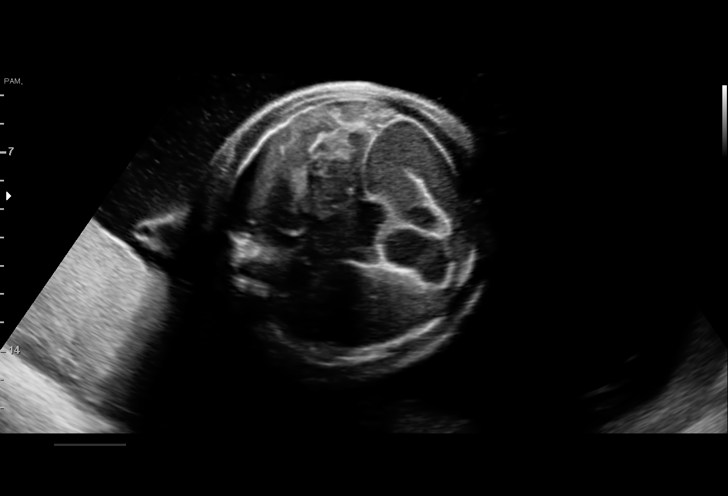
[im 10/27]
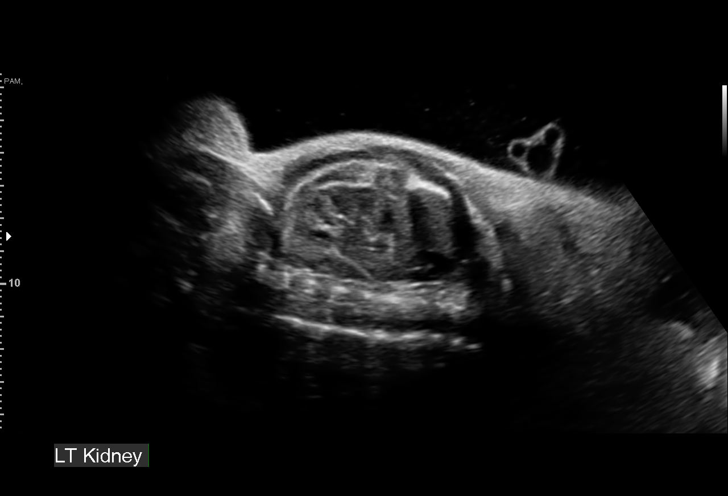
[im 12/27]
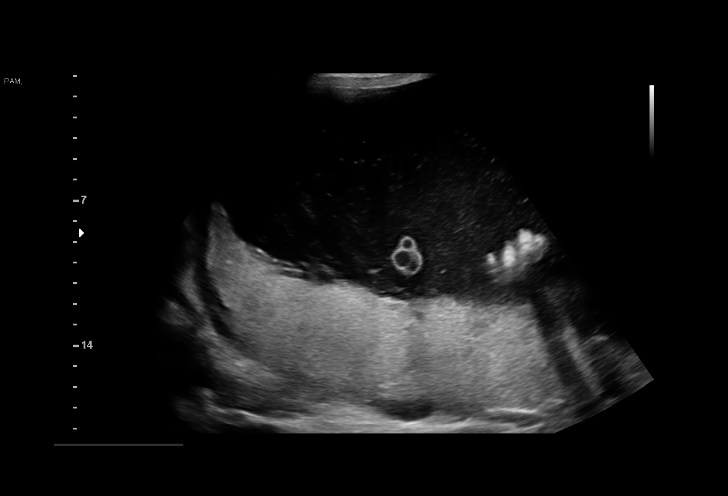
[im 14/27]
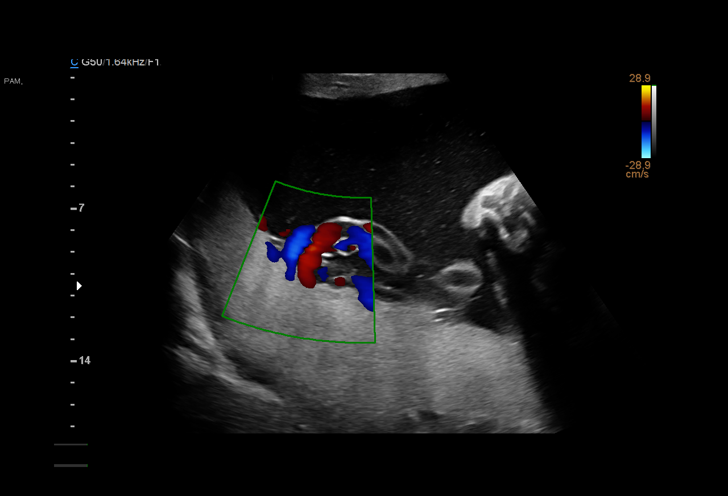
[im 16/27]
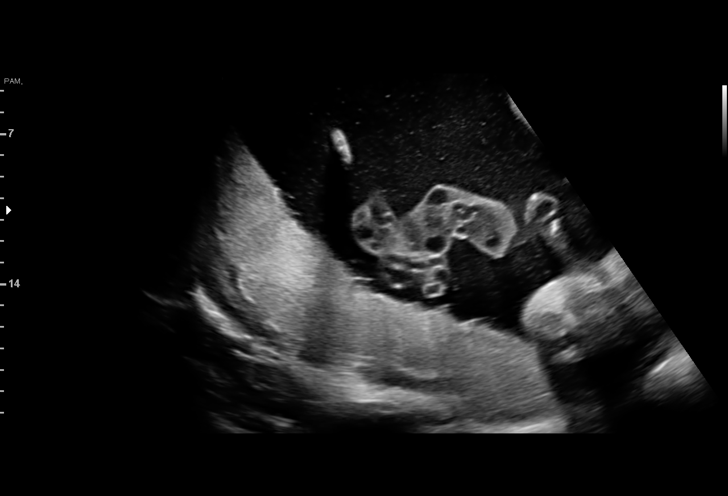
[im 18/27]
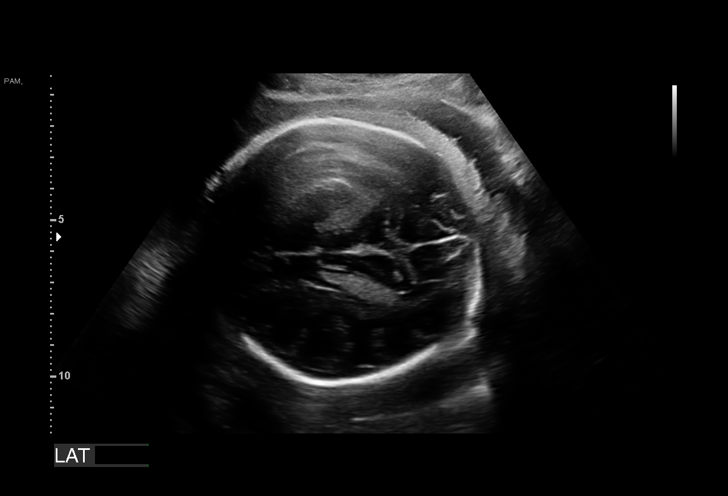
[im 19/27]
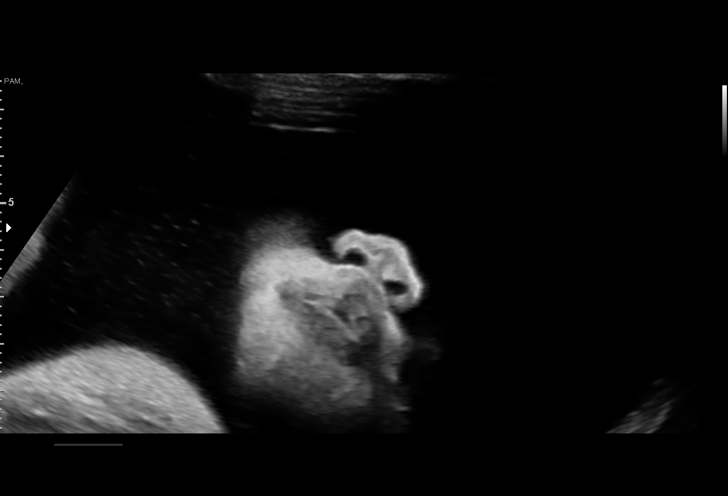
[im 21/27]
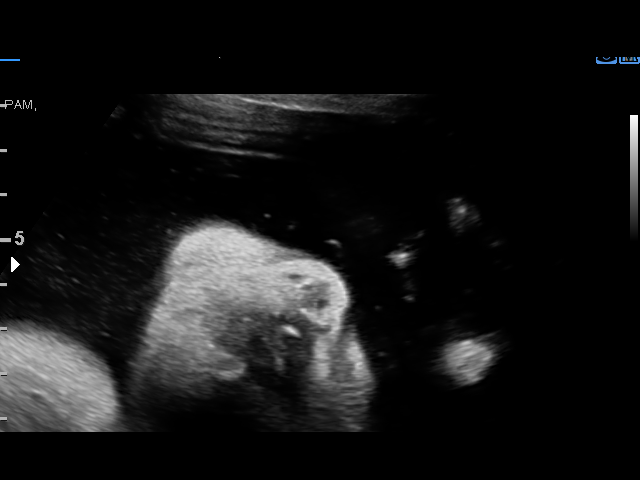
[im 23/27]
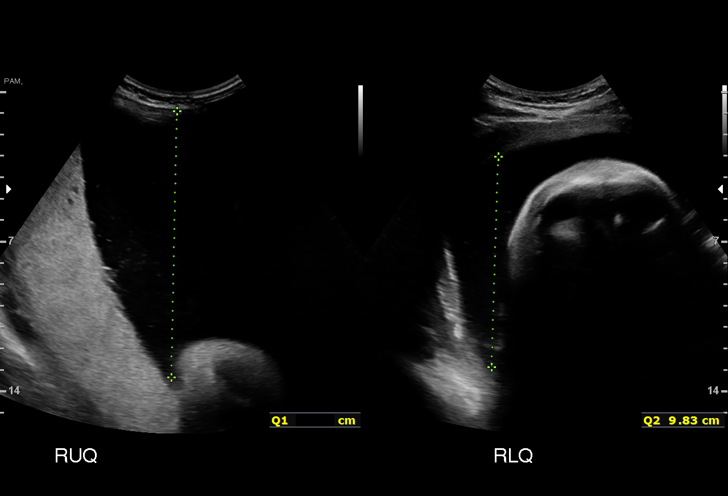
[im 25/27]
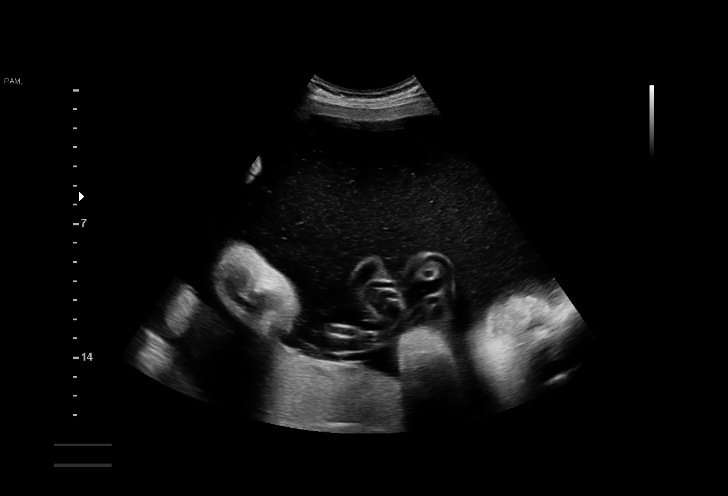
[im 27/27]
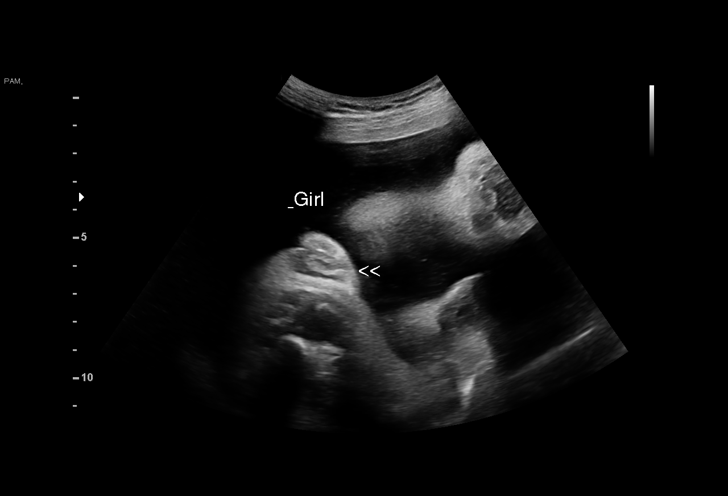

[15 of 27 positions shown; findings below may reference images not displayed]

1  US MFM OB LIMITED                     76815.01    ALBY DIAGNE

Indications

 32 weeks gestation of pregnancy
 Pelvic pain affecting pregnancy in third
 trimester
 Large for gestational age fetus affecting
 management of mother
Fetal Evaluation

 Num Of Fetuses:          1
 Fetal Heart Rate(bpm):   135
 Cardiac Activity:        Observed
 Presentation:            Cephalic
 Placenta:                Posterior
 P. Cord Insertion:       Visualized, central

 Amniotic Fluid
 AFI FV:      Polyhydramnios

 AFI Sum(cm)     %Tile       Largest Pocket(cm)
 34.6            > 97

 RUQ(cm)       RLQ(cm)       LUQ(cm)        LLQ(cm)

Biometry

 BPD:        85  mm     G. Age:  34w 2d         87  %
OB History

 Gravidity:    8         Term:   1        Prem:   0        SAB:   0
 TOP:          6       Ectopic:  0        Living: 1
Gestational Age
 Clinical EDD:  32w 4d                                        EDD:   06/15/20
 U/S Today:     34w 2d                                        EDD:   06/03/20
 Best:          32w 4d     Det. By:  Clinical EDD             EDD:   06/15/20
Anatomy

 Cranium:               Appears normal         Lips:                   Appears normal
 Cavum:                 Appears normal         Stomach:                Appears normal, left
                                                                       sided
 Ventricles:            Appears normal         Abdomen:                Dilated bowel
 Choroid Plexus:        Appears normal         Kidneys:                Appear normal
 Face:                  Profile appears        Bladder:                Appears normal
                        normal
Impression

 Limited exam due pelvic pain
 There is mild bowel dilation with polyhydramnios.
 Recent transfer of care for Arne Sigurd [place].
Recommendations

 Consider weekly testing
 MFM consultation and anatomy exam if not previously
 performed.
 Growth at first available.
 Genetic screeing if not performed previously.

## 2022-12-09 ENCOUNTER — Encounter (HOSPITAL_COMMUNITY): Payer: Self-pay

## 2022-12-09 ENCOUNTER — Ambulatory Visit (HOSPITAL_COMMUNITY)
Admission: EM | Admit: 2022-12-09 | Discharge: 2022-12-09 | Disposition: A | Discharge status: Home / Self Care | Payer: Medicaid Other | Attending: Emergency Medicine | Admitting: Emergency Medicine

## 2022-12-09 DIAGNOSIS — J069 Acute upper respiratory infection, unspecified: Secondary | ICD-10-CM | POA: Diagnosis not present

## 2022-12-09 LAB — POCT RAPID STREP A, ED / UC: Streptococcus, Group A Screen (Direct): NEGATIVE

## 2022-12-09 MED ORDER — LIDOCAINE VISCOUS HCL 2 % MT SOLN: 5.0000 mL | Freq: Four times a day (QID) | OROMUCOSAL | 0 refills | Status: DC | PRN

## 2022-12-09 MED ORDER — BENZONATATE 100 MG PO CAPS: 100.0000 mg | ORAL_CAPSULE | Freq: Three times a day (TID) | ORAL | 0 refills | Status: DC

## 2022-12-09 MED ORDER — AMOXICILLIN 500 MG PO CAPS: 500.0000 mg | ORAL_CAPSULE | Freq: Two times a day (BID) | ORAL | 0 refills | Status: AC

## 2022-12-09 NOTE — Discharge Instructions (Signed)
Strep test is negative  Symptoms have been present for 2 weeks and therefore there is most likely bacteria prolonging your illness  Begin oxacillin every morning and every evening for 10 days  You may use Tessalon Perles every 8 hours to help calm your coughing  You may use Magic mouthwash solution every 4-6 hours to soothe your throat    You can take Tylenol and/or Ibuprofen as needed for fever reduction and pain relief.   For cough: honey 1/2 to 1 teaspoon (you can dilute the honey in water or another fluid).  You can also use guaifenesin and dextromethorphan for cough. You can use a humidifier for chest congestion and cough.  If you don't have a humidifier, you can sit in the bathroom with the hot shower running.      For sore throat: try warm salt water gargles, cepacol lozenges, throat spray, warm tea or water with lemon/honey, popsicles or ice, or OTC cold relief medicine for throat discomfort.   For congestion: take a daily anti-histamine like Zyrtec, Claritin, and a oral decongestant, such as pseudoephedrine.  You can also use Flonase 1-2 sprays in each nostril daily.   It is important to stay hydrated: drink plenty of fluids (water, gatorade/powerade/pedialyte, juices, or teas) to keep your throat moisturized and help further relieve irritation/discomfort.

## 2022-12-09 NOTE — ED Triage Notes (Signed)
Cough that is dry with sore throat onset 1 week. Throat is tender to the touch and pain with swallowing. Taken robitussin, Dayquil, Mucinex, and tea with mild relief.   Son was sick this past Friday.

## 2022-12-09 NOTE — ED Provider Notes (Signed)
[facility]    CSN: [id number] Arrival date & time: 12/09/22  1724      History   Chief Complaint Chief Complaint  Patient presents with   Cough   Sore Throat    HPI Sheri Hanna is a 32 y.o. female.   Patient presents for evaluation of nasal congestion, rhinorrhea and a nonproductive cough present for 2 weeks, cough is persistent and become productive with an accompanying sore throat.  Did have 1 episode of abdominal cramping and diarrhea which has resolved.  Has been able to tolerate food and liquids since.  Denies fever, chills body aches ear pain, shortness of breath or wheezing.  Denies respiratory history.  Non-smoker.    Past Medical History:  Diagnosis Date   Medical history non-contributory     Patient Active Problem List   Diagnosis Date Noted   Preterm premature rupture of membranes (PPROM) with unknown onset of labor 04/24/2020   Polyhydramnios affecting pregnancy in third trimester 04/24/2020   Supervision of other normal pregnancy, antepartum 04/21/2020   Back pain complicating pregnancy, third trimester 04/21/2020   Uterine size date discrepancy pregnancy, third trimester 04/21/2020    Past Surgical History:  Procedure Laterality Date   THERAPEUTIC ABORTION     WISDOM TOOTH EXTRACTION      OB History     Gravida  8   Para  2   Term  1   Preterm  1   AB  6   Living  2      SAB      IAB  6   Ectopic      Multiple  0   Live Births  2            Home Medications    Prior to Admission medications   Medication Sig Start Date End Date Taking? Authorizing Provider  acetaminophen (TYLENOL) 325 MG tablet Take 2 tablets (650 mg total) by mouth every 4 (four) hours as needed (for pain scale < 4). Patient not taking: No sig reported 04/26/20   Sparacino, Hailey L, DO  clotrimazole (LOTRIMIN) 1 % cream Apply 1 application topically 2 (two) times daily. Patient not taking: Reported on 12/15/2020 11/23/20   Shelly Bombard,  MD  clotrimazole (LOTRIMIN) 1 % cream Apply 1 application topically 2 (two) times daily. 01/19/21   Shelly Bombard, MD  Elastic Bandages & Supports (COMFORT FIT MATERNITY SUPP LG) MISC 1 Device by Does not apply route daily as needed. Patient not taking: No sig reported 04/24/20   Gabriel Carina, CNM  etonogestrel-ethinyl estradiol (NUVARING) 0.12-0.015 MG/24HR vaginal ring Insert vaginally and leave in place for 3 consecutive weeks, then remove for 1 week. 06/06/20   Constant, Peggy, MD  fluconazole (DIFLUCAN) 200 MG tablet Take 1 tablet (200 mg total) by mouth every 3 (three) days. 01/19/21   Shelly Bombard, MD  ibuprofen (ADVIL) 600 MG tablet Take 1 tablet (600 mg total) by mouth every 6 (six) hours. Patient not taking: No sig reported 04/26/20   Sparacino, Hailey L, DO  metroNIDAZOLE (FLAGYL) 500 MG tablet Take 1 tablet (500 mg total) by mouth 2 (two) times daily. Patient not taking: No sig reported 06/07/20   Constant, Peggy, MD  metroNIDAZOLE (FLAGYL) 500 MG tablet Take 1 tablet (500 mg total) by mouth 2 (two) times daily. Patient not taking: Reported on 12/15/2020 11/23/20   Shelly Bombard, MD  metroNIDAZOLE (FLAGYL) 500 MG tablet Take 1 tablet (500 mg total) by  mouth 2 (two) times daily. 01/19/21   Shelly Bombard, MD  Prenatal Vit-Fe Fumarate-FA (PRENATAL MULTIVITAMIN) TABS tablet Take 1 tablet by mouth daily at 12 noon. Patient not taking: No sig reported    [provider]    Family History Family History  Problem Relation Age of Onset   Hypertension Mother    Diabetes Maternal Grandmother     Social History Social History   Tobacco Use   Smoking status: Former   Smokeless tobacco: Never   Tobacco comments:    none since + UPT  Substance Use Topics   Alcohol use: Not Currently   Drug use: Never     Allergies   Patient has no known allergies.   Review of Systems Review of Systems   Physical Exam Triage Vital Signs ED Triage Vitals  Enc Vitals  Group     BP 12/09/22 1800 110/89     Pulse Rate 12/09/22 1800 87     Resp 12/09/22 1800 16     Temp 12/09/22 1800 98.1 F (36.7 C)     Temp Source 12/09/22 1800 Oral     SpO2 12/09/22 1800 100 %     Weight 12/09/22 1800 132 lb 15 oz (60.3 kg)     Height 12/09/22 1800 '5\' 2"'$  (1.575 m)     Head Circumference --      Peak Flow --      Pain Score 12/09/22 1759 9     Pain Loc --      Pain Edu? --      Excl. in [place]? --    No data found.  Updated Vital Signs BP 110/89 (BP Location: Left Arm)   Pulse 87   Temp 98.1 F (36.7 C) (Oral)   Resp 16   Ht '5\' 2"'$  (1.575 m)   Wt 132 lb 15 oz (60.3 kg)   LMP 11/21/2022 (Exact Date)   SpO2 100%   BMI 24.31 kg/m   Visual Acuity Right Eye Distance:   Left Eye Distance:   Bilateral Distance:    Right Eye Near:   Left Eye Near:    Bilateral Near:     Physical Exam Constitutional:      Appearance: Normal appearance.  HENT:     Right Ear: Tympanic membrane, ear canal and external ear normal.     Left Ear: Tympanic membrane, ear canal and external ear normal.     Nose: Congestion present. No rhinorrhea.     Mouth/Throat:     Mouth: Mucous membranes are moist.     Pharynx: Oropharynx is clear.  Eyes:     Extraocular Movements: Extraocular movements intact.  Cardiovascular:     Rate and Rhythm: Normal rate and regular rhythm.     Pulses: Normal pulses.     Heart sounds: Normal heart sounds.  Pulmonary:     Effort: Pulmonary effort is normal.     Breath sounds: Normal breath sounds.  Skin:    General: Skin is warm and dry.  Neurological:     Mental Status: She is alert and oriented to person, place, and time. Mental status is at baseline.  Psychiatric:        Mood and Affect: Mood normal.        Behavior: Behavior normal.      UC Treatments / Results  Labs (all labs ordered are listed, but only abnormal results are displayed) Labs Reviewed  POCT RAPID STREP A, ED / UC    EKG  Radiology No results  found.  Procedures Procedures (including critical care time)  Medications Ordered in UC Medications - No data to display  Initial Impression / Assessment and Plan / UC Course  I have reviewed the triage vital signs and the nursing notes.  Pertinent labs & imaging results that were available during my care of the patient were reviewed by me and considered in my medical decision making (see chart for details).  Acute upper respiratory infection  Patient is in no signs of distress nor toxic appearing.  Vital signs are stable.  Low suspicion for pneumonia, pneumothorax or bronchitis and therefore will defer imaging.  Viral testing deferred due to timeline of illness.  Rapid strep test negative.  Prescribed amoxicillin, Magic mouthwash and Tessalon for outpatient management. May use additional over-the-counter medications as needed for supportive care.  May follow-up with urgent care as needed if symptoms persist or worsen.    Final Clinical Impressions(s) / UC Diagnoses   Final diagnoses:  None   Discharge Instructions   None    ED Prescriptions   None    PDMP not reviewed this encounter.   Hans Eden, [place] 12/12/22 [id number]

## 2023-05-22 ENCOUNTER — Ambulatory Visit (INDEPENDENT_AMBULATORY_CARE_PROVIDER_SITE_OTHER): Payer: Medicaid Other

## 2023-05-22 ENCOUNTER — Other Ambulatory Visit (HOSPITAL_COMMUNITY)
Admission: RE | Admit: 2023-05-22 | Discharge: 2023-05-22 | Disposition: A | Discharge status: Home / Self Care | Payer: Medicaid Other | Source: Ambulatory Visit | Attending: Obstetrics | Admitting: Obstetrics

## 2023-05-22 VITALS — BP 111/75 | HR 90 | Ht 62.0 in | Wt 126.6 lb

## 2023-05-22 DIAGNOSIS — Z113 Encounter for screening for infections with a predominantly sexual mode of transmission: Secondary | ICD-10-CM | POA: Insufficient documentation

## 2023-05-22 DIAGNOSIS — F419 Anxiety disorder, unspecified: Secondary | ICD-10-CM | POA: Diagnosis not present

## 2023-05-22 DIAGNOSIS — Z01419 Encounter for gynecological examination (general) (routine) without abnormal findings: Secondary | ICD-10-CM

## 2023-05-22 NOTE — Progress Notes (Signed)
Subjective:     Sheri Hanna is a 32 y.o. female here at [facility] for a routine exam.  Current complaints: Multiple  Patient reports that she did not have follow-up PAP after colpo, needs PAP. Patient reports being sexually active with multiple partners, both female and female and desires STI testing. She does not desire pregnancy and reports she used the Nuvaring in the past for birth control. Patient also reports racing thoughts and anxiety, denies thoughts of harming herself or others, and denies feeling sad or depressed, but reports a history of depression. Patient desires behavioral health referral, and is open to medication if needed. Patient is also requesting referral for primary care, desires help with smoking cessation.   Personal health questionnaire reviewed: yes.  Do you have a primary care provider? Would like to switch, list sent Do you feel safe at home? yes   Health Maintenance Due  Topic Date Due   COVID-19 Vaccine (1 - 2023-24 season) Never done   INFLUENZA VACCINE  05/09/2023     Risk factors for chronic health problems: Smoking: 1 pack/day Alchohol/how much: occasional Pt BMI: Body mass index is 23.16 kg/m.   Gynecologic History Patient's last menstrual period was 04/30/2023. Contraception: none Last Pap: 11/23/20. Results were: abnormal Last mammogram: never. Results were: N/A  Obstetric History OB History  Gravida Para Term Preterm AB Living  8 2 1 1 6 2   SAB IAB Ectopic Multiple Live Births    6   0 2    # Outcome Date GA Lbr Len/2nd Weight Sex Type Anes PTL Lv  8 Preterm 04/25/20 [redacted]w[redacted]d / 00:08 2300 g F Vag-Spont None  LIV  7 Term 04/17/15    M Vag-Spont EPI    6 IAB           5 IAB           4 IAB           3 IAB           2 IAB           1 IAB              The following portions of the patient's history were reviewed and updated as appropriate: allergies, current medications, past family history, past medical history, past social  history, past surgical history, and problem list.  Review of Systems A comprehensive review of systems was negative.    Objective:  BP 111/75   Pulse 90   Ht 5\' 2"  (1.575 m)   Wt 57.4 kg   LMP 04/30/2023   BMI 23.16 kg/m   VS reviewed, nursing note reviewed,  Constitutional: well developed, well nourished, no distress HEENT: normocephalic, thyroid without enlargement or mass HEART: RRR, no murmurs rubs/gallops RESP: clear and equal to auscultation bilaterally in all lobes  Breast Exam: Deferred with low risks and shared decision making, discussed recommendation to start mammogram between 40-50 yo/ exam not performed Abdomen: soft Neuro: alert and oriented x 3 Skin: warm, dry Psych: affect normal Pelvic exam: Performed: Cervix pink, visually closed, without lesion, scant white creamy discharge, vaginal walls and external genitalia normal Bimanual exam: Cervix 0/long/high, firm, anterior, neg CMT, uterus nontender, nonenlarged, adnexa without tenderness, enlargement, or mass        Assessment/Plan:   1. Well woman exam with routine gynecological exam - Cytology - PAP( [place]) - Discussed birth control options with patient; after reviewing her history and risks disc non-hormonal and Progesterone-only  options. Patient will review and plan for future visit  2. Screen for STD (sexually transmitted disease) - Per patient request, STI testing toay  - Cervicovaginal ancillary only( [place]) - HIV antibody (with reflex) - RPR - Hepatitis B Surface AntiGEN - Hepatitis C Antibody  3. Anxiety  - Ambulatory referral to Integrated Behavioral Health   -Follow up in 1 year.  No follow-ups on file.   Karis Juba, Student-MidWife 11:07 AM

## 2023-05-22 NOTE — Patient Instructions (Signed)
[place] Resource Guide (Revised August 2014)    Insufficient Money for Medicine:           United Way: call "211"   MAP Program at [facility] [place] [phone] or [place] [phone]            No Primary Care Doctor:  To locate a primary care doctor that accepts your insurance or provides certain services:           [place] Connect: [phone]           Physician Referral Service: 1-800-533-3463 ask for "My [place]" If no insurance, you need to see if you qualify for GCCN "orange card", call to set      up appointment for eligibility/enrollment at [phone] or [phone] or visit [place] [facility] ([address], [place] and [address] -[place]) to meet with a GCCN enrollment specialist.  Agencies that provide inexpensive (sliding fee scale) medical care:      [facility] - [facility] at [place] - [phone]    [facility]  -  [facility] - [phone]    [facility] - [phone]    [facility] - [phone]    [facility] - [phone]    [facility] - [phone] [facility] - [facility] [place] - [phone] [facility] - [facility] [place] - [phone] [facility]: [phone]  [facility]: [phone]  [facility]anned Parenthood: [phone]  [facility] - [phone]- [phone]    Medicaid-accepting [place] Providers:           [facility] - [phone] (No Family Planning accepted)          [address], [phone], Mon-Fri 9am-5pm          [facility] - [phone], [address], Mon-Thursday 8am-5pm, Fri 8am-noon [facility] - [phone]          [address],  [address], Mon-Fri 7:30am-4:30pm          [facility] - [phone]          [address] [place] [address], Mon-Fri 8am-5pm          [facility] - [phone]          [address]          Only accepts [place] Access Medicaid patients after they have their name applied to their card  Self Pay (no insurance) in [place]:           Sickle Cell Patients:  [address], [phone] [facility]: [address], [place] [phone]       [facility] [address], [place] [phone]  [facility]: [address], [phone]          [facility]           [address], [phone] [facility] [address], [phone]           [facility]             [address], [place] [phone]        [facility] - [address]           [phone], Mon-Fri 9am-7pm, Sat 9am-1pm          [facility] - [facility] [place]          [address] [place] [address], [phone]          [facility] [place]           [address], [phone] [facility] - [facility] [place] [address], [place] [phone]          [facility]           [address] [place] [address], [phone]  [facility]  [address], [phone] [facility] [address], [phone]  Dr. Osei-Bonsu           [address], [place], [phone]          [facility]           [address], [phone]          [facility]             [address], [phone]          [facility]           [address], [phone], 1st & 3rd Saturday every month, 10am-1pm OTHERS:  [facility]  (Immigration Access Clinic Only)  [phone] (Thursday only) Strategies for finding a Primary Care Provider:  1) Find a Doctor and Pay Out of Pocket  Although you won't have to find out who is covered by your insurance plan, it is a good idea to ask around and get recommendations. You will then need to call the office and see if the doctor you have chosen will accept you as a new patient and what types of options they offer for patients who are self-pay. Some doctors offer discounts or will set up payment plans for their patients who do not have insurance, but you will need to ask so you aren't surprised when you get to your appointment.  2) Contact [facility] - To see if you qualify for "orange card" access to healthcare safety net providers.  Call for appointment for eligibility/enrollment at [phone] or [phone]. (Uninsured, 0-200% FPL, qualifying info)  Applicants for GCCN are first required to see if they are eligible to enroll in the ACA Marketplace before enrolling in GCCN (and get an exemption if they are not).  GCCN Criteria for acceptance is:  Proof of ACA Marketing exemption - form or documentation  Valid photo ID (driver's license, state identification card, passport, home country ID)  Proof of [place] residency (e.g. driver's license, lease/landlord information, pay stubs with address, utility bill, bank statement, etc.)  Proof of income (1040, last year's tax return, W2, 4 current pay stubs, other income proof)  Proof of assets (current bank statement + 3 most recent, disability paperwork, life insurance info, tax value on autos, etc.)  3) Contact Your Local Health Department  Not all health departments have doctors that can see patients for sick visits,   but many do, so it is worth a call to see if yours does. If you don't know where your local health  department is, you can check in your phone book. The CDC also has a tool to help you locate your state's health department, and many state websites also have listings of all of their local health departments.  4) Find a Walk-in Clinic  If your illness is not likely to be very severe or complicated, you may want to try a walk in clinic. These are popping up all over the country in pharmacies, drugstores, and shopping centers. They're usually staffed by nurse practitioners or physician assistants that have been trained to treat common illnesses and complaints. They're usually fairly quick and inexpensive. However, if you have serious medical issues or chronic medical problems, these are probably not your best option   STD Testing:           [place] [facility] [place], STD Clinic           [address], [place], phone [phone] or [phone]           Monday - Friday, call for an appointment          [place] [facility] [place], STD Clinic           [address], [place], phone [phone] or [phone]           Monday - Friday, call for an appointment Abuse/Neglect:           [place] Child Abuse Hotline: 641-3795           [place] Child Abuse Hotline: 800-378-5315 (After Hours) Emergency Shelter:  [facility] (336) 271-5959  [facility] [place]- (336) 881-5415  [facility] [place] - (336) 235-0330  [facility] - (336) 375-1332 (ages 11-17)  Homeless Day Shelter @ [facility] - (336) 332-0824   Mammograms - Free at [facility] [place] - 614-3233  Maternity Homes:           [facility]: (336) [phone]   (Homeless mother with children)          [facility]: (704) 372-4663 (Mothers only) [facility]: (336) 370-9232 (Pregnant 16-21 years old) [facility]opt [facility] Mom -(336) 641-7777  [place] Resources  [facility] - [facility] [address], [place] (336) [phone]          [facility] [place]           [address], [place]           [phone]          United Way           [address], [place]           [phone]          [place] [facility]           [address], [place]           [phone]          [place] [facility]           [phone]          [place] [facility] - [facility]           1-888-581-9988            [facility] in [place]           [address]           [phone], Insurance          [place] Child Abuse Hotline           (336) 342-1394           (336) 342-3537 (After Hours)  Behavioral Health Services /Substance Abuse Resources:           [facility] Drug Services: 882-2125           [facility]: 784-9470          [facility]: (336) 285-9073  Narcotics Helpline - 1-866-375-1272          [facility]: (336) 845-3988           Residential & Outpatient Substance Abuse Program - [facility]: 800-659-3381 [employer] - (336) 285-2605 Psychological Services:           Health: [phone]  [facility]: 1-877-626-1772          [facility]           [address] [place] [address], [place]           ACCESS LINE: 1-800-256-2452     (24 Hour) Mobile Crisis:  [place]ES:  [facility] on Mental [facility]ness - [place] (336) 370-4264 [facility] - [place] (800) 451-9682 Walk In Crisis Services       [facility] - [address] [place] [address] - [place]  (336)676-6905        Health - (336)832-9600 or (336) 832-9700  [facility] - [address] - [place] (336)[phone]  [facility] - [address], [place] (336) [phone]   Dental Assistance:   If unable to pay or uninsured, contact: [place] [facility] to become qualified for the adult dental clinic. Patient must be enrolled in GCCN (uninsured, 0-200% FPL, qualifying info).  Enroll in GCCN first, then see Primary Care Physician assigned to you, the PCP makes a dental referral. [facility] will receive referral and contacts patient for appointment.  Patients with Medicaid           [address], [phone]  [facility] (Children up to 20 + Pregnant Women) - (336) [phone]  [facility] - [address] - [address] (336) [phone]  If unable to pay, or uninsured: contact [place] [facility] ([phone] in [place] - (Children only + Pregnant Women), [phone] in [place]- Children only) to become qualified for the adult dental clinic  Must see if eligible to enroll in ACA Health Insurance Marketplace before enrolling into the GCCN (exemption required) (1-855-733-3711 for an appointment)  www.healthcare.gov;   1-800-318-2596.  If not eligible for ACA, then go by Department of Health and Human Services to see if eligible for "orange card."  [address], [place] and [address]- [place].  Once you get an orange card, you will have a Primary Care home who will then refer you to dental if needed.     Other Low-Cost Community Dental Services:   [facility] - [phone] (ext [phone])   [address] [place] [address]  Dr. Civils - [phone]   [address]    [facility] [employer] - [phone]   [address]           [facility]           [address], [place], [place], [address]           [phone], Ext. [phone]           2nd and 4th Thursday of the month at 6:30am (Simple extractions only - no wisdom teeth or surgery) First come/First serve -First 10 clients served           [facility] ([place] and [place] residents only)          [address], [place], [place], [address]            [phone]                    [place] [facility]           [phone]          [place]          [phone]         [facility]          [phone]   Transportation Options:  Ambulance - 911 - $250-$700 per ride Family Member to accompany patient (if stable) - [place] [employer] - (336) 355-6499  PART - (336) 662-0002  Taxi - (336) [phone] - [place]  [facility] - (336) 333-6589 (Application required)  [place] [facility] - (336) [phone]  

## 2023-05-22 NOTE — Progress Notes (Signed)
Pt presents for AEX. Last PAP 11-23-20 Requesting PAP and STD testing. Interesting in [facility] for [facility]. Requesting behavioral health consult

## 2023-05-23 LAB — CERVICOVAGINAL ANCILLARY ONLY
Bacterial Vaginitis (gardnerella): POSITIVE — AB
Candida Glabrata: NEGATIVE
Candida Vaginitis: NEGATIVE
Chlamydia: NEGATIVE
Comment: NEGATIVE
Comment: NEGATIVE
Comment: NEGATIVE
Comment: NEGATIVE
Comment: NEGATIVE
Comment: NORMAL
Neisseria Gonorrhea: NEGATIVE
Trichomonas: NEGATIVE

## 2023-05-23 LAB — HEPATITIS B SURFACE ANTIGEN: Hepatitis B Surface Ag: NEGATIVE

## 2023-05-23 LAB — HIV ANTIBODY (ROUTINE TESTING W REFLEX): HIV Screen 4th Generation wRfx: NONREACTIVE

## 2023-05-23 LAB — HEPATITIS C ANTIBODY: Hep C Virus Ab: NONREACTIVE

## 2023-05-23 LAB — RPR: RPR Ser Ql: NONREACTIVE

## 2023-05-25 ENCOUNTER — Other Ambulatory Visit: Payer: Self-pay

## 2023-05-25 MED ORDER — METRONIDAZOLE 500 MG PO TABS: 500.0000 mg | ORAL_TABLET | Freq: Two times a day (BID) | ORAL | 0 refills | Status: DC

## 2023-05-28 ENCOUNTER — Institutional Professional Consult (permissible substitution): Payer: Medicaid Other | Admitting: Licensed Clinical Social Worker

## 2023-05-28 LAB — CYTOLOGY - PAP: Diagnosis: NEGATIVE

## 2023-06-20 ENCOUNTER — Encounter (HOSPITAL_COMMUNITY): Payer: Self-pay

## 2023-06-20 ENCOUNTER — Ambulatory Visit (HOSPITAL_COMMUNITY)
Admission: EM | Admit: 2023-06-20 | Discharge: 2023-06-20 | Disposition: A | Discharge status: Home / Self Care | Payer: Medicaid Other | Attending: Family Medicine | Admitting: Family Medicine

## 2023-06-20 DIAGNOSIS — J069 Acute upper respiratory infection, unspecified: Secondary | ICD-10-CM | POA: Diagnosis present

## 2023-06-20 DIAGNOSIS — R059 Cough, unspecified: Secondary | ICD-10-CM | POA: Diagnosis not present

## 2023-06-20 DIAGNOSIS — R062 Wheezing: Secondary | ICD-10-CM | POA: Diagnosis present

## 2023-06-20 DIAGNOSIS — B9789 Other viral agents as the cause of diseases classified elsewhere: Secondary | ICD-10-CM | POA: Insufficient documentation

## 2023-06-20 DIAGNOSIS — Z1152 Encounter for screening for COVID-19: Secondary | ICD-10-CM | POA: Diagnosis not present

## 2023-06-20 MED ORDER — PREDNISONE 20 MG PO TABS: 40.0000 mg | ORAL_TABLET | Freq: Every day | ORAL | 0 refills | Status: DC

## 2023-06-20 MED ORDER — PROMETHAZINE-DM 6.25-15 MG/5ML PO SYRP: 5.0000 mL | ORAL_SOLUTION | Freq: Four times a day (QID) | ORAL | 0 refills | Status: DC | PRN

## 2023-06-20 NOTE — ED Provider Notes (Signed)
  [facility]   [id number] 06/20/23 Arrival Time: 0900  ASSESSMENT & PLAN:  1. Viral URI with cough   2. Wheezing    Discussed typical duration of likely viral illness. COVID testing sent at pt request. OTC symptom care as needed.  New Prescriptions   PREDNISONE (DELTASONE) 20 MG TABLET    Take 2 tablets (40 mg total) by mouth daily.   PROMETHAZINE-DEXTROMETHORPHAN (PROMETHAZINE-DM) 6.25-15 MG/5ML SYRUP    Take 5 mLs by mouth 4 (four) times daily as needed for cough.     Follow-up Information     [facility].   Specialty: Urgent Care Why: If worsening or failing to improve as anticipated. Contact information: [address] [place] [address] [phone]                Reviewed expectations re: course of current medical issues. Questions answered. Outlined signs and symptoms indicating need for more acute intervention. Understanding verbalized. After Visit Summary given.   SUBJECTIVE: History from: Patient. Sheri Hanna is a 32 y.o. female. Patient here today with c/o cough, chills, body aches, wheeze, nasal drainage, congestion, headache, fatigue, and ST since yesterday. Patient has tried taking Nyquil and Robitussin with no relief. No sick contacts. Patient states that she traveled to [place] on labor day.  Denies fever/SOB.  OBJECTIVE:  Vitals:   06/20/23 0959 06/20/23 1000  BP:  135/88  Pulse:  94  Resp:  16  Temp:  98 F (36.7 C)  TempSrc:  Oral  SpO2:  97%  Weight: 57.2 kg   Height: 5\' 2"  (1.575 m)     General appearance: alert; no distress Eyes: PERRLA; EOMI; conjunctiva normal HENT: [place]; AT; with nasal congestion Neck: supple  Lungs: speaks full sentences without difficulty; unlabored; bilat exp wheezing Extremities: no edema Skin: warm and dry Neurologic: normal gait Psychological: alert and cooperative; normal mood and affect    No Known Allergies  Past Medical History:   Diagnosis Date   Medical history non-contributory    Social History   Socioeconomic History   Marital status: Single    Spouse name: Not on file   Number of children: Not on file   Years of education: Not on file   Highest education level: Not on file  Occupational History   Not on file  Tobacco Use   Smoking status: Every Day    Types: Cigarettes   Smokeless tobacco: Never   Tobacco comments:    none since + UPT  Vaping Use   Vaping status: Never Used  Substance and Sexual Activity   Alcohol use: Not Currently   Drug use: Never   Sexual activity: Yes    Birth control/protection: None  Other Topics Concern   Not on file  Social History Narrative   Not on file   Social Determinants of Health   Financial Resource Strain: Not on file  Food Insecurity: Not on file  Transportation Needs: Not on file  Physical Activity: Not on file  Stress: Not on file  Social Connections: Not on file  Intimate Partner Violence: Not on file   Family History  Problem Relation Age of Onset   Hypertension Mother    Diabetes Maternal Grandmother    Past Surgical History:  Procedure Laterality Date   THERAPEUTIC ABORTION     WISDOM TOOTH EXTRACTION       Mardella Layman, MD 06/20/23 1026

## 2023-06-20 NOTE — Discharge Instructions (Signed)
You have been tested for COVID-19 today. °If your test returns positive, you will receive a phone call from [facility] regarding your results. °Negative test results are not called. °Both positive and negative results area always visible on MyChart. °If you do not have a MyChart account, sign up instructions are provided in your discharge papers. °Please do not hesitate to contact us should you have questions or concerns. ° °

## 2023-06-20 NOTE — ED Triage Notes (Signed)
Patient here today with c/o cough, chills, body aches, wheeze, nasal drainage, congestion, headache, fatigue, and ST since yesterday. Patient has tried taking Nyquil and Robitussin with no relief. No sick contacts. Patient states that she traveled to [place] on labor day.

## 2023-06-21 LAB — SARS CORONAVIRUS 2 (TAT 6-24 HRS): SARS Coronavirus 2: NEGATIVE

## 2023-09-26 ENCOUNTER — Ambulatory Visit (HOSPITAL_COMMUNITY)
Admission: EM | Admit: 2023-09-26 | Discharge: 2023-09-26 | Disposition: A | Discharge status: Home / Self Care | Payer: Medicaid Other | Attending: Family Medicine | Admitting: Family Medicine

## 2023-09-26 ENCOUNTER — Encounter (HOSPITAL_COMMUNITY): Payer: Self-pay

## 2023-09-26 DIAGNOSIS — H6123 Impacted cerumen, bilateral: Secondary | ICD-10-CM

## 2023-09-26 DIAGNOSIS — R42 Dizziness and giddiness: Secondary | ICD-10-CM | POA: Diagnosis not present

## 2023-09-26 MED ORDER — CIPROFLOXACIN-DEXAMETHASONE 0.3-0.1 % OT SUSP: 4.0000 [drp] | Freq: Two times a day (BID) | OTIC | 0 refills | Status: AC

## 2023-09-26 NOTE — ED Provider Notes (Signed)
[facility]    CSN: [id number] Arrival date & time: 09/26/23  2952      History   Chief Complaint Chief Complaint  Patient presents with   Ear Fullness   Dizziness    HPI Sheri Hanna is a 32 y.o. female.   Sheri Hanna has been experiencing some dizziness and decreased hearing on the right and the sound of echoing in the right ear.  She has had a chronic problem with earwax buildup but never needed to be seen for impaction.  She has not had any sinus congestion, sneezing, sore throat, vision changes, or lightheadedness.  She did notice some bleeding from her right ear this morning.  She has been using Q-tips to try to clear her earwax but has not used any hard or sharp objects to do so.  The history is provided by the patient.  Ear Fullness  Dizziness Associated symptoms: hearing loss   Associated symptoms: no tinnitus     Past Medical History:  Diagnosis Date   Medical history non-contributory     Patient Active Problem List   Diagnosis Date Noted   Preterm premature rupture of membranes (PPROM) with unknown onset of labor 04/24/2020   Polyhydramnios affecting pregnancy in third trimester 04/24/2020   Supervision of other normal pregnancy, antepartum 04/21/2020   Back pain complicating pregnancy, third trimester 04/21/2020   Uterine size date discrepancy pregnancy, third trimester 04/21/2020    Past Surgical History:  Procedure Laterality Date   THERAPEUTIC ABORTION     WISDOM TOOTH EXTRACTION      OB History     Gravida  8   Para  2   Term  1   Preterm  1   AB  6   Living  2      SAB      IAB  6   Ectopic      Multiple  0   Live Births  2            Home Medications    Prior to Admission medications   Medication Sig Start Date End Date Taking? Authorizing Provider  ciprofloxacin-dexamethasone (CIPRODEX) OTIC suspension Place 4 drops into the right ear 2 (two) times daily for 4 days. 09/26/23 09/30/23 Yes Ivor Messier, MD  predniSONE (DELTASONE) 20 MG tablet Take 2 tablets (40 mg total) by mouth daily. 06/20/23   Mardella Layman, MD  promethazine-dextromethorphan (PROMETHAZINE-DM) 6.25-15 MG/5ML syrup Take 5 mLs by mouth 4 (four) times daily as needed for cough. 06/20/23   Mardella Layman, MD    Family History Family History  Problem Relation Age of Onset   Hypertension Mother    Diabetes Maternal Grandmother     Social History Social History   Tobacco Use   Smoking status: Every Day    Types: Cigarettes   Smokeless tobacco: Never   Tobacco comments:    none since + UPT  Vaping Use   Vaping status: Never Used  Substance Use Topics   Alcohol use: Not Currently   Drug use: Never     Allergies   Patient has no known allergies.   Review of Systems Review of Systems  HENT:  Positive for ear discharge, ear pain and hearing loss. Negative for congestion, facial swelling, mouth sores, postnasal drip, rhinorrhea, sinus pressure, sinus pain, sneezing, sore throat and tinnitus.   Eyes:  Negative for pain, discharge, redness and visual disturbance.  Neurological:  Positive for dizziness.     Physical Exam Triage  Vital Signs ED Triage Vitals  Encounter Vitals Group     BP 09/26/23 0903 122/88     Systolic BP Percentile --      Diastolic BP Percentile --      Pulse Rate 09/26/23 0903 78     Resp 09/26/23 0903 16     Temp 09/26/23 0903 98.2 F (36.8 C)     Temp Source 09/26/23 0903 Oral     SpO2 09/26/23 0903 97 %     Weight 09/26/23 0901 125 lb (56.7 kg)     Height 09/26/23 0901 5\' 2"  (1.575 m)     Head Circumference --      Peak Flow --      Pain Score 09/26/23 0901 0     Pain Loc --      Pain Education --      Exclude from Growth Chart --    No data found.  Updated Vital Signs BP 122/88 (BP Location: Left Arm)   Pulse 78   Temp 98.2 F (36.8 C) (Oral)   Resp 16   Ht 5\' 2"  (1.575 m)   Wt 56.7 kg   LMP 09/08/2023 (Exact Date)   SpO2 97%   BMI 22.86 kg/m    Visual Acuity Right Eye Distance:   Left Eye Distance:   Bilateral Distance:    Right Eye Near:   Left Eye Near:    Bilateral Near:     Physical Exam Vitals reviewed.  Constitutional:      General: She is not in acute distress.    Appearance: She is normal weight.  HENT:     Head: Normocephalic and atraumatic.     Right Ear: There is impacted cerumen.     Left Ear: There is impacted cerumen.     Ears:     Comments: Right EAC is occluded with cerumen.  Abrasion on the 7 o'clock position with blood present and friable surrounding tissue with some opaque appearance to it.  Unable to visualize the TM.  After obstruction removed the TM is visualized and is normal.  The left EAC has near occlusion of the canal from cerumen.  Once cerumen removed the TM and EAC appears normal.    Nose: Nose normal.     Mouth/Throat:     Mouth: Mucous membranes are moist.  Eyes:     General: No scleral icterus.       Right eye: No discharge.        Left eye: No discharge.     Extraocular Movements: Extraocular movements intact.     Pupils: Pupils are equal, round, and reactive to light.  Neurological:     Mental Status: She is alert.      UC Treatments / Results  Labs (all labs ordered are listed, but only abnormal results are displayed) Labs Reviewed - No data to display  EKG   Radiology No results found.  Procedures Procedures (including critical care time)  Medications Ordered in UC Medications - No data to display  Initial Impression / Assessment and Plan / UC Course  I have reviewed the triage vital signs and the nursing notes.  Pertinent labs & imaging results that were available during my care of the patient were reviewed by me and considered in my medical decision making (see chart for details).     Cerumen impaction bilaterally - The cerumen was removed today and the patient's hearing changes and dizziness immediately resolved. - There is still a small lesion  in the  external auditory canal on the right.  Will give her some Ciprodex drops to start for the next 4 days and then stop. - We discussed avoidance of Q-tip use and proper ear cleaning techniques. - The patient voiced understanding and agreement to plan..  Final Clinical Impressions(s) / UC Diagnoses   Final diagnoses:  Bilateral impacted cerumen  Dizziness     Discharge Instructions      Start your eardrops on the right side only for the next 4 days.  Monitor for any new bleeding or discharge. Pick up a ear cleaning kit from your local store I would recommend ear softening drops as well      ED Prescriptions     Medication Sig Dispense Auth. Provider   ciprofloxacin-dexamethasone (CIPRODEX) OTIC suspension Place 4 drops into the right ear 2 (two) times daily for 4 days. 1.6 mL Ivor Messier, MD      PDMP not reviewed this encounter.   Ivor Messier, MD 09/26/23 1044

## 2023-09-26 NOTE — Discharge Instructions (Addendum)
Start your eardrops on the right side only for the next 4 days.  Monitor for any new bleeding or discharge. Pick up a ear cleaning kit from your local store I would recommend ear softening drops as well

## 2023-09-26 NOTE — ED Triage Notes (Signed)
Pt presents with complaints of "slight dizziness", blurred vision, and "feeling like my right ear is full." Pt currently denies pain. 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 This morning, "there was dry blood in my right ear and around my earring." Pt states she cleaned the dry blood with a cotton swab after she noticed. Pt took Ibuprofen this morning @ 0610 with no improvement in symptoms.

## 2023-11-19 ENCOUNTER — Encounter (HOSPITAL_COMMUNITY): Payer: Self-pay

## 2023-11-19 ENCOUNTER — Ambulatory Visit (HOSPITAL_COMMUNITY)
Admission: EM | Admit: 2023-11-19 | Discharge: 2023-11-19 | Disposition: A | Discharge status: Home / Self Care | Payer: Medicaid Other | Attending: Family Medicine | Admitting: Family Medicine

## 2023-11-19 DIAGNOSIS — J069 Acute upper respiratory infection, unspecified: Secondary | ICD-10-CM

## 2023-11-19 LAB — POC COVID19/FLU A&B COMBO
Covid Antigen, POC: NEGATIVE
Influenza A Antigen, POC: NEGATIVE
Influenza B Antigen, POC: NEGATIVE

## 2023-11-19 MED ORDER — PREDNISONE 20 MG PO TABS: ORAL_TABLET | ORAL | 0 refills | Status: AC

## 2023-11-19 NOTE — Discharge Instructions (Addendum)
At this time your testing was negative for flu and COVID.  I suspect that you likely have a upper respiratory infection that is likely viral in nature.  Based on your described symptoms and the duration of symptoms it is likely that you have a viral upper respiratory infection (often called a "cold")  Symptoms can last for 3-10 days with lingering cough and intermittent symptoms lasting weeks after that.  The goal of treatment at this time is to reduce your symptoms and discomfort   You can use over the counter medications such as Dayquil/Nyquil, AlkaSeltzer formulations, etc to provide further relief of symptoms according to the manufacturer's instructions  If preferred you can use Coricidin to manage your symptoms rather than those medications mentioned above.   I have sent in a script for Prednisone taper to be taken in the morning with breakfast per the instructions on the container Remember that steroids can cause sleeplessness, irritability, increased hunger and elevated glucose levels so be mindful of these side effects. They should lessen as you progress to the lower doses of the taper.   If your symptoms do not improve or become worse in the next 5-7 days please make an apt at the office so we can see you  Go to the ER if you begin to have more serious symptoms such as shortness of breath, trouble breathing, loss of consciousness, swelling around the eyes, high fever, severe lasting headaches, vision changes or neck pain/stiffness.

## 2023-11-19 NOTE — ED Triage Notes (Signed)
Pt c/o cough, fatigue, headache, wheezing, and SOB on exertion since yesterday. States took robitussin x1 yesterday.

## 2023-11-19 NOTE — ED Provider Notes (Signed)
[facility]    CSN: [id number] Arrival date & time: 11/19/23  0803      History   Chief Complaint Chief Complaint  Patient presents with   Cough    HPI Sheri Hanna is a 33 y.o. female.   HPI  She states her symptoms started with scratchy throat on Sun  She then started to develop other symptoms yesterday She states she has dry cough, runny nose, congestion, body aches, fever, She reports getting SOB with her usual exertion  Interventions: robitussin yesterday. She has also been drinking tea      Past Medical History:  Diagnosis Date   Medical history non-contributory     Patient Active Problem List   Diagnosis Date Noted   Preterm premature rupture of membranes (PPROM) with unknown onset of labor 04/24/2020   Polyhydramnios affecting pregnancy in third trimester 04/24/2020   Supervision of other normal pregnancy, antepartum 04/21/2020   Back pain complicating pregnancy, third trimester 04/21/2020   Uterine size date discrepancy pregnancy, third trimester 04/21/2020    Past Surgical History:  Procedure Laterality Date   THERAPEUTIC ABORTION     WISDOM TOOTH EXTRACTION      OB History     Gravida  8   Para  2   Term  1   Preterm  1   AB  6   Living  2      SAB      IAB  6   Ectopic      Multiple  0   Live Births  2            Home Medications    Prior to Admission medications   Medication Sig Start Date End Date Taking? Authorizing Provider  predniSONE (DELTASONE) 20 MG tablet Take 60mg  PO daily x 2 days, then40mg  PO daily x 2 days, then 20mg  PO daily x 3 days 11/19/23  Yes Tylan Briguglio E, PA-C    Family History Family History  Problem Relation Age of Onset   Hypertension Mother    Diabetes Maternal Grandmother     Social History Social History   Tobacco Use   Smoking status: Every Day    Types: Cigarettes   Smokeless tobacco: Never   Tobacco comments:    none since + UPT  Vaping Use   Vaping status:  Never Used  Substance Use Topics   Alcohol use: Not Currently   Drug use: Never     Allergies   Patient has no known allergies.   Review of Systems Review of Systems  Constitutional:  Positive for fatigue and fever. Negative for chills.  HENT:  Positive for congestion, postnasal drip, rhinorrhea and sore throat. Negative for ear pain.   Respiratory:  Positive for cough. Negative for shortness of breath and wheezing.   Gastrointestinal:  Positive for nausea. Negative for diarrhea and vomiting.  Musculoskeletal:  Positive for myalgias.  Neurological:  Positive for headaches.     Physical Exam Triage Vital Signs ED Triage Vitals [11/19/23 0827]  Encounter Vitals Group     BP 123/83     Systolic BP Percentile      Diastolic BP Percentile      Pulse Rate 93     Resp 18     Temp 99.3 F (37.4 C)     Temp Source Oral     SpO2 97 %     Weight      Height      Head Circumference  Peak Flow      Pain Score 0     Pain Loc      Pain Education      Exclude from Growth Chart    No data found.  Updated Vital Signs BP 123/83 (BP Location: Left Arm)   Pulse 93   Temp 99.3 F (37.4 C) (Oral)   Resp 18   LMP 11/08/2023   SpO2 97%   Visual Acuity Right Eye Distance:   Left Eye Distance:   Bilateral Distance:    Right Eye Near:   Left Eye Near:    Bilateral Near:     Physical Exam Vitals reviewed.  Constitutional:      General: She is awake.     Appearance: Normal appearance. She is well-developed and well-groomed.  HENT:     Head: Normocephalic and atraumatic.     Right Ear: Hearing, tympanic membrane and ear canal normal.     Left Ear: Hearing, tympanic membrane and ear canal normal.     Mouth/Throat:     Lips: Pink.     Mouth: Mucous membranes are moist.     Pharynx: Oropharynx is clear. Uvula midline. No pharyngeal swelling, oropharyngeal exudate, posterior oropharyngeal erythema, uvula swelling or postnasal drip.  Cardiovascular:     Rate and Rhythm:  Normal rate and regular rhythm.     Pulses: Normal pulses.          Radial pulses are 2+ on the right side and 2+ on the left side.     Heart sounds: Normal heart sounds. No murmur heard.    No friction rub. No gallop.  Pulmonary:     Effort: Pulmonary effort is normal.     Breath sounds: No decreased air movement. Examination of the right-middle field reveals decreased breath sounds. Decreased breath sounds and wheezing present. No rhonchi or rales.  Musculoskeletal:     Cervical back: Normal range of motion and neck supple.  Lymphadenopathy:     Head:     Right side of head: No submental, submandibular or preauricular adenopathy.     Left side of head: No submental, submandibular or preauricular adenopathy.     Cervical:     Right cervical: No superficial cervical adenopathy.    Left cervical: No superficial cervical adenopathy.     Upper Body:     Right upper body: No supraclavicular adenopathy.     Left upper body: No supraclavicular adenopathy.  Neurological:     General: No focal deficit present.     Mental Status: She is alert and oriented to person, place, and time.  Psychiatric:        Mood and Affect: Mood normal.        Behavior: Behavior normal. Behavior is cooperative.        Thought Content: Thought content normal.      UC Treatments / Results  Labs (all labs ordered are listed, but only abnormal results are displayed) Labs Reviewed  POC COVID19/FLU A&B COMBO    EKG   Radiology No results found.  Procedures Procedures (including critical care time)  Medications Ordered in UC Medications - No data to display  Initial Impression / Assessment and Plan / UC Course  I have reviewed the triage vital signs and the nursing notes.  Pertinent labs & imaging results that were available during my care of the patient were reviewed by me and considered in my medical decision making (see chart for details).      Final  Clinical Impressions(s) / UC Diagnoses    Final diagnoses:  Viral upper respiratory tract infection   Acute, new concern Patient presents today with concerns for persistent dry cough, shortness of breath on exertion, fevers, chills for the past day. Rapid flu and COVID were negative.  Results reviewed with patient at the end of appointment. Will treat symptomatically for acute viral URI.  Due to mild wheezing in decreased breath sounds we will send in a steroid taper.  Vitals are reassuring today so less concerned for pneumonia at this time.  Recommend over-the-counter medications to supplement symptomatic management.  ED and return precautions reviewed and provided in the after visit summary.  Follow-up as needed for progressing or persistent symptoms     Discharge Instructions      At this time your testing was negative for flu and COVID.  I suspect that you likely have a upper respiratory infection that is likely viral in nature.  Based on your described symptoms and the duration of symptoms it is likely that you have a viral upper respiratory infection (often called a "cold")  Symptoms can last for 3-10 days with lingering cough and intermittent symptoms lasting weeks after that.  The goal of treatment at this time is to reduce your symptoms and discomfort   You can use over the counter medications such as Dayquil/Nyquil, AlkaSeltzer formulations, etc to provide further relief of symptoms according to the manufacturer's instructions  If preferred you can use Coricidin to manage your symptoms rather than those medications mentioned above.   I have sent in a script for Prednisone taper to be taken in the morning with breakfast per the instructions on the container Remember that steroids can cause sleeplessness, irritability, increased hunger and elevated glucose levels so be mindful of these side effects. They should lessen as you progress to the lower doses of the taper.   If your symptoms do not improve or become worse  in the next 5-7 days please make an apt at the office so we can see you  Go to the ER if you begin to have more serious symptoms such as shortness of breath, trouble breathing, loss of consciousness, swelling around the eyes, high fever, severe lasting headaches, vision changes or neck pain/stiffness.       ED Prescriptions     Medication Sig Dispense Auth. Provider   predniSONE (DELTASONE) 20 MG tablet Take 60mg  PO daily x 2 days, then40mg  PO daily x 2 days, then 20mg  PO daily x 3 days 13 tablet Mariene Dickerman E, PA-C      PDMP not reviewed this encounter.   Roselind Messier 11/19/23 1191

## 2024-02-17 NOTE — Progress Notes (Unsigned)
 ANNUAL EXAM Patient name: Sheri Hanna MRN [id number]  Date of birth: 1990-12-17 Chief Complaint:   Annual Exam  History of Present Illness:   Sheri Hanna is a 33 y.o. [id number] female being seen today for a routine annual exam.   Current complaints: Vulvar skin tag and bumps on vaginal walls that come and go. Unsure of timeline. They bother her after bathing when they feel dry. She uses vaseline for relief. She denies burning, itching, malodor, abnormal discharge.  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 Would like referral to continue care.   Patient's last menstrual period was 02/13/2024 (exact date).  Last pap 05/22/23 NILM, 11/23/20 NILM HRHPV+ (18/45) no colpo done.  Last mammogram: Never done due to age. Family h/o breast cancer: no Last colonoscopy: Never done due to age. Family h/o colorectal cancer: no STI screening: Accepts Contraception: None    Review of Systems:   Pertinent items are noted in HPI Denies any headaches, blurred vision, fatigue, shortness of breath, chest pain, abdominal pain, abnormal vaginal discharge/itching/odor/irritation, problems with periods, bowel movements, urination, or intercourse unless otherwise stated above. Pertinent History Reviewed:  Reviewed past medical,surgical, social and family history.  Reviewed problem list, medications and allergies. Physical Assessment:   Vitals:   02/19/24 1516  BP: 115/69  Pulse: 79  Weight: 130 lb 1.6 oz (59 kg)  Height: 5\' 2"  (1.575 m)  Body mass index is 23.8 kg/m.        Physical Examination:   General appearance - well appearing, and in no distress  Mental status - alert, oriented to person, place, and time  Psych:  She has a normal mood and affect  Skin - warm and dry, normal color, no suspicious lesions noted  Chest - effort normal, all lung fields clear to auscultation bilaterally  Heart - normal rate and regular rhythm  Neck:  midline trachea, no  thyromegaly or nodules  Breasts - breasts appear normal, no suspicious masses, no skin or nipple changes or  axillary nodes  Abdomen - soft, nontender, nondistended, no masses or organomegaly  Pelvic - VULVA: +1 cm, flattened genital wart on inner aspect left labia minora, 5 o'clock.  Otherwise normal appearing vulva with no masses, tenderness.  VAGINA: +7-8 .5 cm, non-tender, pedunculated papules with cauliflower-like appearance along posterior and lateral sides of introitus. No discoloration, ulcerations, bleeding lesions. Vagina with normal color and discharge. CERVIX: normal appearing cervix without discharge or lesions, no CMT  Thin prep pap is done with HR HPV cotesting  UTERUS: uterus is felt to be normal size, shape, consistency and nontender   ADNEXA: No adnexal masses or tenderness noted.  Extremities:  No swelling or varicosities noted  Chaperone present for exam  No results found for this or any previous visit (from the past 24 hours).  Assessment & Plan:  1. Encounter for annual routine gynecological examination (Primary) - GC/CT: accepts - Birth Control: POPs - Breast Health: Encouraged self breast awareness/SBE. Teaching provided.  - Mammogram: @ 34yo, or sooner if problems - Colonoscopy: @ 34yo, or sooner if problems - F/U 12 months and prn  - Cervicovaginal ancillary only( [place]) - RPR - HIV antibody (with reflex) - Hepatitis C Antibody - Hepatitis B Surface AntiGEN - Ambulatory referral to Dermatology  2. Vulvovaginal warts Patient with solitary vulvar wart of left labia minora and 7-8 pedunculated, papular vaginal lesions at introitus. We discussed that the main indication for treatment of vulvovaginal warts is alleviation of  bothersome symptoms/distress.That removal of warts does not rid of HPV virus, and that there is possibility of regrowth. We discussed that warts do not pose serious risks to health or fertility and she may choose expectant management to see  if the warts spontaneously resolve.   3. Cervical cancer screening - Recent history: 05/22/23 NILM, 11/23/20 NILM HRHPV+ (18/45) no colpo done. - Cervical cancer screening: Discussed guidelines. Pap with HPV updated today.  - Cytology - PAP   4. Encounter for initial prescription of contraceptive pills - Reviewed different types of birth control available: OCPs, vaginal ring, Nexplanon , Depo, various types of IUDs, permanent sterilization.  We reviewed the advantages and risks of each. We discussed possible AEs. She has no known history of HTN, stroke, DM, liver/gallbladder disease. She does smoke daily.  - Reviewed that birth control does not protect against STI. - Patient desires: POPs. Discussed importance of taking on time daily with need for back-up method for first 7 days  Orders Placed This Encounter  Procedures   Herpes simplex virus culture   RPR   HIV antibody (with reflex)   Hepatitis C Antibody   Hepatitis B Surface AntiGEN   Ambulatory referral to Dermatology    Meds:  Meds ordered this encounter  Medications   norethindrone  (MICRONOR ) 0.35 MG tablet    Sig: Take 1 tablet (0.35 mg total) by mouth daily.    Dispense:  28 tablet    Refill:  11    Follow-up: No follow-ups on file.  Lesa Vandall E Ltanya Bayley, [place] 02/19/2024 10:31 PM

## 2024-02-19 ENCOUNTER — Encounter: Payer: Self-pay | Admitting: Physician Assistant

## 2024-02-19 ENCOUNTER — Ambulatory Visit: Admitting: Physician Assistant

## 2024-02-19 ENCOUNTER — Other Ambulatory Visit (HOSPITAL_COMMUNITY)
Admission: RE | Admit: 2024-02-19 | Discharge: 2024-02-19 | Disposition: A | Discharge status: Home / Self Care | Source: Ambulatory Visit | Attending: Physician Assistant | Admitting: Physician Assistant

## 2024-02-19 VITALS — BP 115/69 | HR 79 | Ht 62.0 in | Wt 130.1 lb

## 2024-02-19 DIAGNOSIS — Z124 Encounter for screening for malignant neoplasm of cervix: Secondary | ICD-10-CM

## 2024-02-19 DIAGNOSIS — A630 Anogenital (venereal) warts: Secondary | ICD-10-CM | POA: Diagnosis not present

## 2024-02-19 DIAGNOSIS — Z113 Encounter for screening for infections with a predominantly sexual mode of transmission: Secondary | ICD-10-CM | POA: Insufficient documentation

## 2024-02-19 DIAGNOSIS — Z01419 Encounter for gynecological examination (general) (routine) without abnormal findings: Secondary | ICD-10-CM

## 2024-02-19 DIAGNOSIS — Z30011 Encounter for initial prescription of contraceptive pills: Secondary | ICD-10-CM | POA: Diagnosis not present

## 2024-02-19 DIAGNOSIS — Z1151 Encounter for screening for human papillomavirus (HPV): Secondary | ICD-10-CM | POA: Insufficient documentation

## 2024-02-19 MED ORDER — NORETHINDRONE 0.35 MG PO TABS: 1.0000 | ORAL_TABLET | Freq: Every day | ORAL | 11 refills | Status: AC

## 2024-02-20 LAB — HEPATITIS C ANTIBODY: Hep C Virus Ab: NONREACTIVE

## 2024-02-20 LAB — CERVICOVAGINAL ANCILLARY ONLY
Bacterial Vaginitis (gardnerella): POSITIVE — AB
Candida Glabrata: NEGATIVE
Candida Vaginitis: NEGATIVE
Chlamydia: NEGATIVE
Comment: NEGATIVE
Comment: NEGATIVE
Comment: NEGATIVE
Comment: NEGATIVE
Comment: NEGATIVE
Comment: NORMAL
Neisseria Gonorrhea: NEGATIVE
Trichomonas: NEGATIVE

## 2024-02-20 LAB — HIV ANTIBODY (ROUTINE TESTING W REFLEX): HIV Screen 4th Generation wRfx: NONREACTIVE

## 2024-02-20 LAB — HEPATITIS B SURFACE ANTIGEN: Hepatitis B Surface Ag: NEGATIVE

## 2024-02-20 LAB — SYPHILIS: RPR W/REFLEX TO RPR TITER AND TREPONEMAL ANTIBODIES, TRADITIONAL SCREENING AND DIAGNOSIS ALGORITHM: RPR Ser Ql: NONREACTIVE

## 2024-02-21 LAB — CYTOLOGY - PAP
Comment: NEGATIVE
Diagnosis: NEGATIVE
High risk HPV: NEGATIVE

## 2024-02-22 DIAGNOSIS — A630 Anogenital (venereal) warts: Secondary | ICD-10-CM | POA: Insufficient documentation

## 2024-02-22 LAB — HERPES SIMPLEX VIRUS CULTURE

## 2024-02-22 NOTE — Patient Instructions (Signed)
What are anogenital warts?     Anogenital warts are small growths that form on the vulva (the lips of the vagina), vagina, penis, anus, or skin near these areas. They can be skin-colored, pink, or brown in color.  Anogenital warts are caused by a virus called human papillomavirus, or "HPV." You can catch this virus during sex. Anogenital warts are often called just "genital warts."    Is HPV dangerous?   The type of HPV that causes most forms of genital warts is not usually dangerous. But other types of HPV can lead to cancer of the cervix (the bottom part of the uterus that leads to the vagina), penis, or anus.    Do genital warts hurt or itch?   Not usually. Most people with genital warts have no symptoms (other than the warts). But some people have itching, burning, or tenderness.    Is there a test for genital warts?   Your doctor or nurse can usually see if you have warts just by doing an exam. They might also take small samples of tissue (called a "biopsy") if it is not clear. But this is not usually necessary.    How are genital warts treated?   There are several treatments that can help get rid of the warts. Your doctor or nurse can help you to choose the best treatment for you.    Some medicines work by slowly destroying the warts. Others work by getting your body's own infection-fighting system to attack the warts.    Most medicines are lotions or gels that are put on the warts. They have to be applied 1 or more times a week for many weeks. Some medicines must be applied by a doctor or nurse. Others you can apply yourself.    There are different treatments to remove warts:  ?Cryotherapy - This uses a chemical to freeze the warts.  ?Electrocautery - This uses electricity to burn the warts off.  ?Traditional surgery - This involves cutting the warts off.  ?Laser surgery - This uses light to destroy the warts.  ?Ultrasound surgery - This uses sound waves to break up and remove the warts (this is not available  in many places).    What if I am pregnant?   If you are pregnant, tell your doctor before you get treated for genital warts. Some of the treatments for genital warts are safe to use during pregnancy, but others are not.    What happens after treatment?   Your doctor or nurse might ask you to check once in a while to see if they come back.  Even if you cannot see any warts, you might still have HPV. This means that warts can come back at any time. If warts do come back, you could spread them to someone else.    Can genital warts be prevented?   Yes. You can get vaccines against HPV. They can help prevent infection with most of the forms of HPV that cause warts. But the vaccines can prevent infection only if you get vaccinated before you get warts.    You can also protect yourself by not having sex with anyone who is infected with HPV. But that can be hard, because people do not always know that they have the virus. Condoms can help reduce the risk of infection. But they do not totally protect you. The virus can live on places on the skin not covered by a condom.

## 2024-02-23 ENCOUNTER — Other Ambulatory Visit: Payer: Self-pay | Admitting: Physician Assistant

## 2024-02-23 ENCOUNTER — Ambulatory Visit: Payer: Self-pay | Admitting: Physician Assistant

## 2024-02-23 DIAGNOSIS — N760 Acute vaginitis: Secondary | ICD-10-CM

## 2024-02-23 MED ORDER — METRONIDAZOLE 500 MG PO TABS: 500.0000 mg | ORAL_TABLET | Freq: Two times a day (BID) | ORAL | 0 refills | Status: DC

## 2024-02-23 NOTE — Progress Notes (Signed)
 Rx sent for BV

## 2024-03-22 ENCOUNTER — Other Ambulatory Visit: Payer: Self-pay

## 2024-03-22 ENCOUNTER — Ambulatory Visit (HOSPITAL_COMMUNITY): Admission: EM | Admit: 2024-03-22 | Discharge: 2024-03-22 | Disposition: A | Discharge status: Home / Self Care

## 2024-03-22 ENCOUNTER — Encounter (HOSPITAL_COMMUNITY): Payer: Self-pay | Admitting: Emergency Medicine

## 2024-03-22 DIAGNOSIS — J020 Streptococcal pharyngitis: Secondary | ICD-10-CM | POA: Diagnosis not present

## 2024-03-22 LAB — POC COVID19/FLU A&B COMBO
Covid Antigen, POC: NEGATIVE
Influenza A Antigen, POC: NEGATIVE
Influenza B Antigen, POC: NEGATIVE

## 2024-03-22 LAB — POCT RAPID STREP A (OFFICE): Rapid Strep A Screen: POSITIVE — AB

## 2024-03-22 MED ORDER — AZELASTINE HCL 0.1 % NA SOLN: 1.0000 | Freq: Two times a day (BID) | NASAL | 1 refills | Status: AC

## 2024-03-22 MED ORDER — AMOXICILLIN 500 MG PO TABS: 500.0000 mg | ORAL_TABLET | Freq: Two times a day (BID) | ORAL | 0 refills | Status: AC

## 2024-03-22 NOTE — ED Provider Notes (Signed)
 [facility]  Note:  This document was prepared using Dragon voice recognition software and may include unintentional dictation errors.  MRN: [id number] DOB: 1991-01-17  Subjective:   Sheri Hanna is a 33 y.o. female presenting for nasal congestion, sore throat, chills since yesterday.  Patient denies any cough, fever, shortness of breath, chest pain, weakness, dizziness.  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  No known exposure to COVID, flu, strep.  Patient would like strep testing in urgent care.  Patient reports taking over-the-counter cough and cold medication with minimal improvement.  Patient reports severe sore throat with swallowing.  No current facility-administered medications for this encounter.  Current Outpatient Medications:    amoxicillin  (AMOXIL ) 500 MG tablet, Take 1 tablet (500 mg total) by mouth 2 (two) times daily for 10 days., Disp: 20 tablet, Rfl: 0   azelastine (ASTELIN) 0.1 % nasal spray, Place 1 spray into both nostrils 2 (two) times daily. Use in each nostril as directed, Disp: 30 mL, Rfl: 1   norethindrone  (MICRONOR ) 0.35 MG tablet, Take 1 tablet (0.35 mg total) by mouth daily., Disp: 28 tablet, Rfl: 11   predniSONE  (DELTASONE ) 20 MG tablet, Take 60mg  PO daily x 2 days, then40mg  PO daily x 2 days, then 20mg  PO daily x 3 days (Patient not taking: Reported on 02/19/2024), Disp: 13 tablet, Rfl: 0   No Known Allergies  Past Medical History:  Diagnosis Date   Medical history non-contributory      Past Surgical History:  Procedure Laterality Date   THERAPEUTIC ABORTION     WISDOM TOOTH EXTRACTION      Family History  Problem Relation Age of Onset   Hypertension Mother    Diabetes Maternal Grandmother     Social History   Tobacco Use   Smoking status: Every Day    Types: Cigarettes   Smokeless tobacco: Never   Tobacco comments:    none since + UPT  Vaping Use   Vaping status: Never Used   Substance Use Topics   Alcohol use: Yes   Drug use: Never    ROS Refer to HPI for ROS details.  Objective:   Vitals: BP 117/79 (BP Location: Right Arm)   Pulse 88   Temp 98 F (36.7 C) (Oral)   Resp 16   LMP 03/08/2024 (Approximate)   SpO2 97%   Physical Exam Vitals and nursing note reviewed.  Constitutional:      General: She is not in acute distress.    Appearance: She is well-developed. She is not ill-appearing or toxic-appearing.  HENT:     Head: Normocephalic and atraumatic.     Nose: Congestion present. No rhinorrhea.     Mouth/Throat:     Mouth: Mucous membranes are moist.     Pharynx: Oropharynx is clear. Posterior oropharyngeal erythema present. No oropharyngeal exudate.   Eyes:     General:        Right eye: No discharge.        Left eye: No discharge.     Extraocular Movements: Extraocular movements intact.     Conjunctiva/sclera: Conjunctivae normal.    Cardiovascular:     Rate and Rhythm: Normal rate.  Pulmonary:     Effort: Pulmonary effort is normal. No respiratory distress.   Musculoskeletal:        General: Normal range of motion.   Skin:    General: Skin is warm and dry.   Neurological:  General: No focal deficit present.     Mental Status: She is alert and oriented to person, place, and time.   Psychiatric:        Mood and Affect: Mood normal.        Behavior: Behavior normal.     Procedures  Results for orders placed or performed during the hospital encounter of 03/22/24 (from the past 24 hours)  POC rapid strep A     Status: Abnormal   Collection Time: 03/22/24 11:09 AM  Result Value Ref Range   Rapid Strep A Screen Positive (A) Negative  POC Covid19/Flu A&B Antigen     Status: Normal   Collection Time: 03/22/24 11:22 AM  Result Value Ref Range   Influenza A Antigen, POC Negative Negative   Influenza B Antigen, POC Negative Negative   Covid Antigen, POC Negative Negative    No results found.   Assessment and Plan :      Discharge Instructions       1. Strep pharyngitis (Primary) - POC rapid strep A performed in UC is positive for strep pharyngitis - POC Covid19/Flu A&B Antigen performed in UC is negative for COVID and influenza - azelastine (ASTELIN) 0.1 % nasal spray; Place 1 spray into both nostrils 2 (two) times daily. Use in each nostril as directed for treatment of nasal congestion and postnasal drip - amoxicillin  (AMOXIL ) 500 MG tablet; Take 1 tablet (500 mg total) by mouth 2 (two) times daily for 10 days for treatment of strep pharyngitis -Continue to monitor symptoms for any change in severity if there is any escalation of current symptoms or development of new symptoms follow-up in ER for further evaluation and management.      Daylynn Stumpp B Makaley Storts   Katharin Schneider, [place], [place] 03/22/24 [phone]

## 2024-03-22 NOTE — ED Triage Notes (Signed)
 Pt c/o running nose, sore throat and chills since yesterday.

## 2024-03-22 NOTE — Discharge Instructions (Addendum)
  1. Strep pharyngitis (Primary) - POC rapid strep A performed in UC is positive for strep pharyngitis - POC Covid19/Flu A&B Antigen performed in UC is negative for COVID and influenza - azelastine (ASTELIN) 0.1 % nasal spray; Place 1 spray into both nostrils 2 (two) times daily. Use in each nostril as directed for treatment of nasal congestion and postnasal drip - amoxicillin  (AMOXIL ) 500 MG tablet; Take 1 tablet (500 mg total) by mouth 2 (two) times daily for 10 days for treatment of strep pharyngitis -Continue to monitor symptoms for any change in severity if there is any escalation of current symptoms or development of new symptoms follow-up in ER for further evaluation and management.

## 2024-03-31 ENCOUNTER — Other Ambulatory Visit: Payer: Self-pay

## 2024-03-31 DIAGNOSIS — B9689 Other specified bacterial agents as the cause of diseases classified elsewhere: Secondary | ICD-10-CM

## 2024-03-31 MED ORDER — METRONIDAZOLE 500 MG PO TABS: 500.0000 mg | ORAL_TABLET | Freq: Two times a day (BID) | ORAL | 0 refills | Status: AC

## 2024-08-20 ENCOUNTER — Ambulatory Visit

## 2024-08-27 ENCOUNTER — Ambulatory Visit

## 2024-08-27 ENCOUNTER — Other Ambulatory Visit (HOSPITAL_COMMUNITY)
Admission: RE | Admit: 2024-08-27 | Discharge: 2024-08-27 | Disposition: A | Discharge status: Home / Self Care | Source: Ambulatory Visit | Attending: Obstetrics and Gynecology | Admitting: Obstetrics and Gynecology

## 2024-08-27 VITALS — BP 109/72 | HR 90 | Ht 62.0 in | Wt 132.1 lb

## 2024-08-27 DIAGNOSIS — Z113 Encounter for screening for infections with a predominantly sexual mode of transmission: Secondary | ICD-10-CM | POA: Insufficient documentation

## 2024-08-27 NOTE — Progress Notes (Signed)
 SUBJECTIVE:  33 y.o. female complains of vaginal itching began 2 weeks ago following nair use on vulva Denies abnormal vaginal bleeding or significant pelvic pain or fever. No UTI symptoms. Denies history of known exposure to STD.  No LMP recorded.  OBJECTIVE:  She appears well, afebrile.  ASSESSMENT:  Vaginal itching   PLAN:  GC, chlamydia, trichomonas, BVAG, CVAG probe sent to lab. Pt requested blood work for screening Treatment: To be determined once lab results are received ROV prn if symptoms persist or worsen.

## 2024-08-28 ENCOUNTER — Ambulatory Visit: Payer: Self-pay | Admitting: Obstetrics and Gynecology

## 2024-08-28 LAB — HEPATITIS B SURFACE ANTIGEN: Hepatitis B Surface Ag: NEGATIVE

## 2024-08-28 LAB — CERVICOVAGINAL ANCILLARY ONLY
Bacterial Vaginitis (gardnerella): POSITIVE — AB
Candida Glabrata: NEGATIVE
Candida Vaginitis: POSITIVE — AB
Chlamydia: NEGATIVE
Comment: NEGATIVE
Comment: NEGATIVE
Comment: NEGATIVE
Comment: NEGATIVE
Comment: NEGATIVE
Comment: NORMAL
Neisseria Gonorrhea: NEGATIVE
Trichomonas: NEGATIVE

## 2024-08-28 LAB — HEPATITIS C ANTIBODY: Hep C Virus Ab: NONREACTIVE

## 2024-08-28 LAB — SYPHILIS: RPR W/REFLEX TO RPR TITER AND TREPONEMAL ANTIBODIES, TRADITIONAL SCREENING AND DIAGNOSIS ALGORITHM: RPR Ser Ql: NONREACTIVE

## 2024-08-28 LAB — HIV ANTIBODY (ROUTINE TESTING W REFLEX): HIV Screen 4th Generation wRfx: NONREACTIVE

## 2024-08-28 MED ORDER — FLUCONAZOLE 150 MG PO TABS: 150.0000 mg | ORAL_TABLET | Freq: Once | ORAL | 0 refills | Status: AC

## 2024-08-28 MED ORDER — METRONIDAZOLE 500 MG PO TABS: 500.0000 mg | ORAL_TABLET | Freq: Two times a day (BID) | ORAL | 0 refills | Status: AC

## 2025-01-20 ENCOUNTER — Ambulatory Visit: Admitting: Dermatology
